# Patient Record
Sex: Male | Born: 1990 | Race: White | Hispanic: No | Marital: Single | Smoking: Current every day smoker
Health system: Southern US, Community
[De-identification: ages and names within clinical notes are randomized; demographics above are authoritative.]

## PROBLEM LIST (undated history)

## (undated) DIAGNOSIS — R454 Irritability and anger: Secondary | ICD-10-CM

## (undated) DIAGNOSIS — F32A Depression, unspecified: Secondary | ICD-10-CM

## (undated) DIAGNOSIS — F329 Major depressive disorder, single episode, unspecified: Secondary | ICD-10-CM

## (undated) DIAGNOSIS — F1910 Other psychoactive substance abuse, uncomplicated: Secondary | ICD-10-CM

## (undated) DIAGNOSIS — B1920 Unspecified viral hepatitis C without hepatic coma: Secondary | ICD-10-CM

## (undated) DIAGNOSIS — F319 Bipolar disorder, unspecified: Secondary | ICD-10-CM

## (undated) DIAGNOSIS — F909 Attention-deficit hyperactivity disorder, unspecified type: Secondary | ICD-10-CM

## (undated) HISTORY — PX: TOOTH EXTRACTION: SUR596

---
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
## 2010-10-04 ENCOUNTER — Emergency Department (HOSPITAL_COMMUNITY)
Admission: EM | Admit: 2010-10-04 | Discharge: 2010-10-04 | Payer: Self-pay | Source: Home / Self Care | Admitting: Emergency Medicine

## 2012-07-26 ENCOUNTER — Encounter (HOSPITAL_COMMUNITY): Payer: Self-pay | Admitting: Family Medicine

## 2012-07-26 ENCOUNTER — Emergency Department (HOSPITAL_COMMUNITY)
Admission: EM | Admit: 2012-07-26 | Discharge: 2012-07-26 | Disposition: A | Discharge status: Home / Self Care | Payer: Self-pay | Attending: Emergency Medicine | Admitting: Emergency Medicine

## 2012-07-26 DIAGNOSIS — S50819A Abrasion of unspecified forearm, initial encounter: Secondary | ICD-10-CM

## 2012-07-26 DIAGNOSIS — F17200 Nicotine dependence, unspecified, uncomplicated: Secondary | ICD-10-CM | POA: Insufficient documentation

## 2012-07-26 DIAGNOSIS — X789XXA Intentional self-harm by unspecified sharp object, initial encounter: Secondary | ICD-10-CM | POA: Insufficient documentation

## 2012-07-26 DIAGNOSIS — F319 Bipolar disorder, unspecified: Secondary | ICD-10-CM | POA: Insufficient documentation

## 2012-07-26 DIAGNOSIS — F909 Attention-deficit hyperactivity disorder, unspecified type: Secondary | ICD-10-CM | POA: Insufficient documentation

## 2012-07-26 DIAGNOSIS — IMO0002 Reserved for concepts with insufficient information to code with codable children: Secondary | ICD-10-CM | POA: Insufficient documentation

## 2012-07-26 DIAGNOSIS — R45851 Suicidal ideations: Secondary | ICD-10-CM | POA: Insufficient documentation

## 2012-07-26 LAB — CBC WITH DIFFERENTIAL/PLATELET
Eosinophils Relative: 2 % (ref 0–5)
HCT: 47.6 % (ref 39.0–52.0)
Lymphocytes Relative: 26 % (ref 12–46)
Lymphs Abs: 2 10*3/uL (ref 0.7–4.0)
MCV: 92.1 fL (ref 78.0–100.0)
Monocytes Absolute: 0.7 10*3/uL (ref 0.1–1.0)
RBC: 5.17 MIL/uL (ref 4.22–5.81)
WBC: 7.7 10*3/uL (ref 4.0–10.5)

## 2012-07-26 LAB — URINALYSIS, ROUTINE W REFLEX MICROSCOPIC
Hgb urine dipstick: NEGATIVE
Protein, ur: NEGATIVE mg/dL
Urobilinogen, UA: 1 mg/dL (ref 0.0–1.0)

## 2012-07-26 LAB — COMPREHENSIVE METABOLIC PANEL
BUN: 9 mg/dL (ref 6–23)
CO2: 29 mEq/L (ref 19–32)
Calcium: 9.4 mg/dL (ref 8.4–10.5)
Creatinine, Ser: 1.11 mg/dL (ref 0.50–1.35)
GFR calc Af Amer: >90 mL/min (ref >90)
GFR calc non Af Amer: >90 mL/min (ref >90)
Glucose, Bld: 126 mg/dL — ABNORMAL HIGH (ref 70–99)

## 2012-07-26 LAB — RAPID URINE DRUG SCREEN, HOSP PERFORMED
Opiates: NOT DETECTED
Tetrahydrocannabinol: NOT DETECTED

## 2012-07-26 MED ORDER — NICOTINE 21 MG/24HR TD PT24
21.0000 mg | MEDICATED_PATCH | Freq: Once | TRANSDERMAL | Status: DC
  Administered 2012-07-26: 21 mg via TRANSDERMAL
  Filled 2012-07-26: qty 1

## 2012-07-26 MED ORDER — TETANUS-DIPHTH-ACELL PERTUSSIS 5-2.5-18.5 LF-MCG/0.5 IM SUSP
0.5000 mL | Freq: Once | INTRAMUSCULAR | Status: AC
  Administered 2012-07-26: 0.5 mL via INTRAMUSCULAR
  Filled 2012-07-26: qty 0.5

## 2012-07-26 MED ORDER — ACETAMINOPHEN 325 MG PO TABS: 650.0000 mg | ORAL_TABLET | ORAL | Status: DC | PRN

## 2012-07-26 MED ORDER — LORAZEPAM 1 MG PO TABS
1.0000 mg | ORAL_TABLET | Freq: Three times a day (TID) | ORAL | Status: DC | PRN
  Administered 2012-07-26: 1 mg via ORAL
  Filled 2012-07-26: qty 1

## 2012-07-26 MED ORDER — IBUPROFEN 200 MG PO TABS: 600.0000 mg | ORAL_TABLET | Freq: Three times a day (TID) | ORAL | Status: DC | PRN
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
## 2012-07-26 NOTE — ED Notes (Signed)
Security notified for wand patient

## 2012-07-26 NOTE — ED Notes (Signed)
Regular diet tray, no sharps, ordered for patient

## 2012-07-26 NOTE — ED Provider Notes (Signed)
  Physical Exam  BP 108/68  Pulse 74  Temp 98.2 F (36.8 C) (Oral)  Resp 19  SpO2 98%  Physical Exam  ED Course  Procedures  MDM Patient has been seen by telepsych and discharge has been recommended. He'll be discharged with community mental health followup.      Juliet Rude. Rubin Payor, MD 07/26/12 2154

## 2012-07-26 NOTE — ED Notes (Signed)
Per pt used rusty razorblade to carve girlfriends name in arm. Denies SI or HI. Came here for tetanus shot

## 2012-07-26 NOTE — ED Provider Notes (Addendum)
History   This chart was scribed for Richardean Canal, MD by Charolett Bumpers . The patient was seen in room TR05C/TR05C. Patient's care was started at 1756.   CSN: [id number] Arrival date & time 07/26/12  1716  First MD Initiated Contact with Patient 07/26/12 1756      Chief Complaint  Patient presents with  . Wound Check    tetanus shot    The history is provided by the patient. No language interpreter was used.  Caleb Ellis is a 21 y.o. male who presents to the Emergency Department complaining of multiple superficial abrasions to his left forearm. He states he used a rusty razorblade to carve his girlfriends name into his arm. He reports some mild SI and states he "can't do this anymore". He denies HI. He states he has superficial abrasions on his knuckles from punching walls. He states that his tetanus is not UTD. He reports a h/o ADHD, Bipolar disorder and aggressive behavior. He states that he currently does not take medications but requests psychiatric help. He reports he is a smoker. He reports occasional alcohol, marijuana and pill use.   History reviewed. No pertinent past medical history.  History reviewed. No pertinent past surgical history.  History reviewed. No pertinent family history.  History  Substance Use Topics  . Smoking status: Current Every Day Smoker  . Smokeless tobacco: Not on file  . Alcohol Use: No      Review of Systems  Constitutional: Negative for fever and chills.  Respiratory: Negative for shortness of breath.   Gastrointestinal: Negative for nausea and vomiting.  Skin: Positive for wound.  Neurological: Negative for weakness.  All other systems reviewed and are negative.    Allergies  Review of patient's allergies indicates no known allergies.  Home Medications   Current Outpatient Rx  Name Route Sig Dispense Refill  . DIPHENHYDRAMINE-APAP (SLEEP) 25-500 MG PO TABS Oral Take 3 tablets by mouth at bedtime as needed. For pain       BP 108/68  Pulse 74  Temp 98.2 F (36.8 C) (Oral)  Resp 19  SpO2 98%  Physical Exam  Nursing note and vitals reviewed. Constitutional: He is oriented to person, place, and time. He appears well-developed and well-nourished. No distress.       Pt is anxious, but NAD.  HENT:  Head: Normocephalic and atraumatic.  Eyes: EOM are normal.  Neck: Neck supple. No tracheal deviation present.  Cardiovascular: Normal rate, regular rhythm and normal heart sounds.   No murmur heard. Pulmonary/Chest: Effort normal and breath sounds normal. No respiratory distress. He has no wheezes.  Musculoskeletal: Normal range of motion.  Neurological: He is alert and oriented to person, place, and time.  Skin: Skin is warm and dry.       Superficial abrasions to left forearm, spelling out Autumn. Abrasions to knuckles.   Psychiatric: He has a normal mood and affect. His behavior is normal.    ED Course  Procedures (including critical care time)  DIAGNOSTIC STUDIES: Oxygen Saturation is 97% on room air, normal by my interpretation.    COORDINATION OF CARE:  18:30-Discussed planned course of treatment with the patient including keeping the wound clean, who is agreeable at this time. Discussed strict return precautions in which to return to ED. Pt states that he also needs psychiatry help. Will order a consult with ACT team and Telepsych, full psychiatric work up and move to [employer] [facility].   18:38-Medication Orders: Acetaminophen (Tylenol) tablet 650  mg-every 4 hrs PRN, Ibuprofen (Advil, Motrin) tablet 600 mg-every 8 hrs PRN; Lorazepam (Ativan) tablet 1 mg-every 8 hrs PRN  18:45-Medication Orders: TDap (Boostrix) injection 0.5 mL-once.   Labs Reviewed  COMPREHENSIVE METABOLIC PANEL - Abnormal; Notable for the following:    Glucose, Bld 126 (*)     Total Bilirubin 0.2 (*)     All other components within normal limits  CBC WITH DIFFERENTIAL  ETHANOL  URINALYSIS, ROUTINE W REFLEX MICROSCOPIC  URINE RAPID  DRUG SCREEN (HOSP PERFORMED)   No results found.   1. Abrasion of forearm   2. Suicidal ideation   3. Bipolar 1 disorder       MDM  Caleb Ellis is a 21 y.o. male here with aggressive behavior and multiple abrasions. He will receive tetanus shot. Will also need psych to evaluate him for mild suicidal ideations and restarting his bipolar meds. Will transfer to behavioral health.    This document was completed by the scribe at my direction and I have reviewed its accuracy. I have personally examined the patient and agrees with the above document.   Chaney Malling, MD      Richardean Canal, MD 07/26/12 1845  Richardean Canal, MD 07/26/12 [phone]

## 2012-07-26 NOTE — ED Notes (Signed)
Behavioral Patient Checklist completed.  Patient Belongings Inventoried: 1 khaki/jean pants, 1 belt, 1 pair sneakers, 1 black/gray hoody. No jewelry, electronics, medicine or miscellaneous items.

## 2012-07-26 NOTE — ED Notes (Signed)
Patient has been wanded by security.  

## 2012-07-26 NOTE — ED Notes (Signed)
Patient unable to void at this time. Urinal provided

## 2012-07-26 NOTE — ED Notes (Signed)
Pt speaking with Specialists on Call at this time. Family at bedside.

## 2012-07-26 NOTE — ED Notes (Signed)
Patient report given to [facility], RN. Patient will be wanded and transported to POD C for psych evaluation/clearance

## 2012-11-16 ENCOUNTER — Emergency Department (HOSPITAL_COMMUNITY)
Admission: EM | Admit: 2012-11-16 | Discharge: 2012-11-16 | Disposition: A | Discharge status: Home / Self Care | Payer: Self-pay | Attending: Emergency Medicine | Admitting: Emergency Medicine

## 2012-11-16 DIAGNOSIS — IMO0002 Reserved for concepts with insufficient information to code with codable children: Secondary | ICD-10-CM | POA: Insufficient documentation

## 2012-11-16 DIAGNOSIS — F17200 Nicotine dependence, unspecified, uncomplicated: Secondary | ICD-10-CM | POA: Insufficient documentation

## 2012-11-16 DIAGNOSIS — L02419 Cutaneous abscess of limb, unspecified: Secondary | ICD-10-CM

## 2012-11-16 NOTE — ED Notes (Signed)
Pt c/o abscess to R axilla area. States he noticed it about 2 weeks ago. Area painful to touch per pt.

## 2012-11-16 NOTE — Progress Notes (Signed)
Pt noted without pcp and insurance coverage noted in EPIC. Cm reviewed and provided pt with [facility] community [facility] information for [place] self pay pcps and affordable care act information Pt appreciative of resources offered

## 2012-11-16 NOTE — ED Provider Notes (Signed)
History   This chart was scribed for non-physician practitioner working with Derwood Kaplan, MD by Leone Payor, ED Scribe. This patient was seen in room WTR8/WTR8 and the patient's care was started at 1504.   CSN: [id number]  Arrival date & time 11/16/12  1504   First MD Initiated Contact with Patient 11/16/12 1511      No chief complaint on file.    The history is provided by the patient. No language interpreter was used.    Caleb Ellis is a 22 y.o. male who presents to the Emergency Department complaining of an abscess to his R axillary region that he noticed 1.5 weeks ago. States the abscess has been gradually increasing. He had an abscess similar to this in the past which went away after he "popped" it. He tried taking ibuprofen 1 day ago with no relief. Admits to associated irritation with pain rated 2/10, worse with palpation. No drainage. Denies fever or chills.  Pt is a current everyday smoker but denies alcohol use. No past medical history on file.  No past surgical history on file.  No family history on file.  History  Substance Use Topics  . Smoking status: Current Every Day Smoker  . Smokeless tobacco: Not on file  . Alcohol Use: No      Review of Systems A complete 10 system review of systems was obtained and all systems are negative except as noted in the HPI and PMH.    Allergies  Review of patient's allergies indicates no known allergies.  Home Medications   Current Outpatient Rx  Name  Route  Sig  Dispense  Refill  . DIPHENHYDRAMINE-APAP (SLEEP) 25-500 MG PO TABS   Oral   Take 3 tablets by mouth at bedtime as needed. For pain           There were no vitals taken for this visit.  Physical Exam  Nursing note and vitals reviewed. Constitutional: He is oriented to person, place, and time. He appears well-developed and well-nourished. No distress.  HENT:  Head: Normocephalic and atraumatic.  Eyes: Conjunctivae normal and EOM are normal.   Neck: Normal range of motion. Neck supple. No tracheal deviation present.  Cardiovascular: Normal rate, regular rhythm and normal heart sounds.   Pulmonary/Chest: Effort normal and breath sounds normal. No respiratory distress. He has no wheezes. He has no rales. He exhibits no tenderness.  Musculoskeletal: Normal range of motion.  Neurological: He is alert and oriented to person, place, and time.  Skin: Skin is warm and dry.       2 cm diameter indurated abscess present under R axilla. No drainage or surrounding cellulitis.  Psychiatric: He has a normal mood and affect. His behavior is normal.    ED Course  Procedures (including critical care time) INCISION AND DRAINAGE Performed by: Johnnette Gourd Consent: Verbal consent obtained. Risks and benefits: risks, benefits and alternatives were discussed Type: abscess  Body area: R axilla  Anesthesia: local infiltration  Incision was made with a scalpel.  Local anesthetic: lidocaine 2% with epinephrine  Anesthetic total: 2 ml  Complexity: complex Blunt dissection to break up loculations  Drainage: purulent  Drainage amount: small  Packing material: none  Patient tolerance: Patient tolerated the procedure well with no immediate complications.    DIAGNOSTIC STUDIES: Oxygen Saturation is 98% on room air, normal by my interpretation.    COORDINATION OF CARE:  3:24 PM Discussed treatment plan which includes I&D with pt at bedside and pt agreed to  plan.     Labs Reviewed - No data to display No results found.   1. Abscess of axilla       MDM  22 y/o male with abscess without cellulitis. Small amount of purulent drainage present. No surrounding cellulitis. Advised him to f/u for recheck in 2 days. Advised warm compresses. Return precautions discussed. Patient states understanding of plan and is agreeable.      I personally performed the services described in this documentation, which was scribed in my presence.  The recorded information has been reviewed and is accurate.    Trevor Mace, PA-C 11/16/12 [phone]

## 2012-11-17 NOTE — ED Provider Notes (Signed)
Medical screening examination/treatment/procedure(s) were performed by non-physician practitioner and as supervising physician I was immediately available for consultation/collaboration.  Derwood Kaplan, MD 11/17/12 4010

## 2013-04-13 ENCOUNTER — Emergency Department (HOSPITAL_COMMUNITY)
Admission: EM | Admit: 2013-04-13 | Discharge: 2013-04-14 | Disposition: A | Discharge status: Home / Self Care | Payer: Self-pay | Attending: Emergency Medicine | Admitting: Emergency Medicine

## 2013-04-13 ENCOUNTER — Encounter (HOSPITAL_COMMUNITY): Payer: Self-pay

## 2013-04-13 DIAGNOSIS — R454 Irritability and anger: Secondary | ICD-10-CM

## 2013-04-13 DIAGNOSIS — F17200 Nicotine dependence, unspecified, uncomplicated: Secondary | ICD-10-CM | POA: Insufficient documentation

## 2013-04-13 DIAGNOSIS — F911 Conduct disorder, childhood-onset type: Secondary | ICD-10-CM | POA: Insufficient documentation

## 2013-04-13 DIAGNOSIS — F319 Bipolar disorder, unspecified: Secondary | ICD-10-CM | POA: Insufficient documentation

## 2013-04-13 DIAGNOSIS — R45851 Suicidal ideations: Secondary | ICD-10-CM | POA: Insufficient documentation

## 2013-04-13 HISTORY — DX: Irritability and anger: R45.4

## 2013-04-13 HISTORY — DX: Bipolar disorder, unspecified: F31.9

## 2013-04-13 HISTORY — DX: Attention-deficit hyperactivity disorder, unspecified type: F90.9

## 2013-04-13 LAB — CBC
Hemoglobin: 16.7 g/dL (ref 13.0–17.0)
MCH: 31.4 pg (ref 26.0–34.0)
RBC: 5.32 MIL/uL (ref 4.22–5.81)

## 2013-04-13 LAB — COMPREHENSIVE METABOLIC PANEL
ALT: 7 U/L (ref 0–53)
Alkaline Phosphatase: 75 U/L (ref 39–117)
CO2: 28 mEq/L (ref 19–32)
Calcium: 9.6 mg/dL (ref 8.4–10.5)
Chloride: 101 mEq/L (ref 96–112)
GFR calc Af Amer: >90 mL/min (ref >90)
GFR calc non Af Amer: >90 mL/min (ref >90)
Glucose, Bld: 94 mg/dL (ref 70–99)
Potassium: 4.3 mEq/L (ref 3.5–5.1)
Sodium: 136 mEq/L (ref 135–145)
Total Bilirubin: 0.4 mg/dL (ref 0.3–1.2)

## 2013-04-13 LAB — ETHANOL: Alcohol, Ethyl (B): <11 mg/dL (ref 0–11)

## 2013-04-13 MED ORDER — ALUM & MAG HYDROXIDE-SIMETH 200-200-20 MG/5ML PO SUSP: 30.0000 mL | ORAL | Status: DC | PRN

## 2013-04-13 MED ORDER — IBUPROFEN 200 MG PO TABS: 600.0000 mg | ORAL_TABLET | Freq: Three times a day (TID) | ORAL | Status: DC | PRN

## 2013-04-13 MED ORDER — ACETAMINOPHEN 325 MG PO TABS: 650.0000 mg | ORAL_TABLET | ORAL | Status: DC | PRN

## 2013-04-13 MED ORDER — ZOLPIDEM TARTRATE 5 MG PO TABS: 5.0000 mg | ORAL_TABLET | Freq: Every evening | ORAL | Status: DC | PRN

## 2013-04-13 MED ORDER — NICOTINE 21 MG/24HR TD PT24: 21.0000 mg | MEDICATED_PATCH | Freq: Every day | TRANSDERMAL | Status: DC

## 2013-04-13 MED ORDER — ONDANSETRON HCL 4 MG PO TABS
4.0000 mg | ORAL_TABLET | Freq: Three times a day (TID) | ORAL | Status: DC | PRN
Start: 2013-04-13 — End: 2013-04-14

## 2013-04-13 NOTE — ED Notes (Signed)
telepsych info faxed/called

## 2013-04-13 NOTE — ED Notes (Signed)
Pt reports being diagnosed ADHD, Bipolar, Depression, Anger Issues in his adolescent years and that mom took him to [employer] when he was 79 and said he was +SI, then she took him a second time and on discharge he went to group homes until he aged out. He hasn't been on meds since he's been out of the [facility] setting but wants to be back on meds but doesn't have insurance. He said he doesn't want the meds to be strong and make him feel like a zombie. He describes extreme mood swings but a desire to control them as well. He said today he argued with his gf he has a son with and also his best friend he lives with and then he just wanted to be left alone so he went to [place] to use WIFI and while there waiting for another friend to pick him up and take him to another friend's house he texted the friend he argued with and lives with and said he was going to go jump off a parking deck and kill himself. Pt is adamant he couldn't kill himself and he said it seeking attention. He has no prior suicide attempts per his report. He admits to being a cutter because he was sad but hasn't cut in a year. He admits to [facility] use and UDS was +THC. He admits to being hard for mom to handle and destroying property and such when he got angry before but he said he's calmed down a  Lot and doesn't do that anymore. He is willing to do outpatient treatment. He's trying to find a job.

## 2013-04-13 NOTE — ED Notes (Signed)
Telepsych in progress. 

## 2013-04-13 NOTE — ED Provider Notes (Signed)
History    This chart was scribed for non-physician practitioner Marlon Pel PA-C working with Juliet Rude. Rubin Payor, MD by Ashley Jacobs, ED scribe. This patient was seen in room WTR1/WLPT1 and the patient's care was started at 5:32 PM  CSN: [id number] Arrival date & time 04/13/13  1647    Chief Complaint  Patient presents with  . Medical Clearance    The history is provided by the police and the patient. No language interpreter was used.   HPI Comments: Caleb Ellis is a 22 y.o. male who presents to the Emergency Department brought in by [employer] after his friend reports he was experiencing SI.  Pt denies this in ED.  His friend reports he threatened to jump off a parking deck after an argument between the two.  Pt states he "just wanted attention".  He reports he is just angry with his friend and with other life stressors.  He reports marijuana use and denies alcohol use or use of any other drugs.  Pt has h/o anxiety and bipolar and no longer takes medication due to cost.  Denies true SI, HI, or anxiety. Admits to depression, bipolar disorder and anger outbursts. Is not taking any medication because it is too expensive. Medicates himself with weed and says it works pretty good. Lives with said friend that called [employer] on him.   No past medical history on file. No past surgical history on file. No family history on file. History  Substance Use Topics  . Smoking status: Current Every Day Smoker  . Smokeless tobacco: Not on file  . Alcohol Use: No    Review of Systems  All other systems reviewed and are negative.    Allergies  Review of patient's allergies indicates no known allergies.  Home Medications   Current Outpatient Rx  Name  Route  Sig  Dispense  Refill  . diphenhydrAMINE (BENADRYL) 12.5 MG/5ML liquid   Oral   Take 25 mg by mouth once.          BP 111/72  Pulse 60  Temp(Src) 98.1 F (36.7 C) (Oral)  Resp 15  SpO2 97% Physical Exam  Nursing note and  vitals reviewed. Constitutional: He appears well-developed and well-nourished. No distress.  HENT:  Head: Normocephalic and atraumatic.  Mouth/Throat: Oropharynx is clear and moist. No oropharyngeal exudate.  Eyes: Conjunctivae are normal. No scleral icterus.  Neck: Normal range of motion. Neck supple.  Cardiovascular: Normal rate, regular rhythm and intact distal pulses.   Pulmonary/Chest: Effort normal and breath sounds normal. No respiratory distress. He has no wheezes.  Abdominal: Soft. Bowel sounds are normal. He exhibits no mass. There is no tenderness. There is no rebound and no guarding.  Musculoskeletal: Normal range of motion. He exhibits no edema.  Neurological: He is alert.  Speech is clear and goal oriented Moves extremities without ataxia  Skin: Skin is warm and dry. He is not diaphoretic.  Psychiatric: His mood appears not anxious. His affect is blunt. His speech is not rapid and/or pressured. He is not agitated and not aggressive. He exhibits a depressed mood. He expresses no homicidal and no suicidal ideation. He expresses no suicidal plans and no homicidal plans.  Denies suicidal ideation     ED Course  Procedures (including critical care time) DIAGNOSTIC STUDIES: Oxygen Saturation is 97% on room air, normal by my interpretation.    COORDINATION OF CARE: 5:55 PM Discussed course of care with pt which includes being evaluated by Psychiatrist. Pt understands and  agrees.   Labs Reviewed  CBC  COMPREHENSIVE METABOLIC PANEL  URINE RAPID DRUG SCREEN (HOSP PERFORMED)  ETHANOL   No results found. 1. Bipolar 1 disorder   2. Outbursts of anger   3. Suicidal ideation     MDM  Telepsych ordered. HOlding orders placed. Med rec reviewed,   I personally performed the services described in this documentation, which was scribed in my presence. The recorded information has been reviewed and is accurate.   Dorthula Matas, PA-C 04/13/13 1756

## 2013-04-13 NOTE — ED Notes (Signed)
Pt BIB [employer]. Pt was threatening to jump off a parking deck and pt's friend called [employer] to report this. Pt states he is not suicidal and that he told his friend that because "I just wanted attention." Pt also states, "I would never kill myself, I'm too scared to." Pt goes on and states that he has a kid he needs to take care of and wouldn't kill himself. Pt is calm, cooperative. [employer] outside of pt's room.

## 2013-04-13 NOTE — ED Notes (Signed)
Dr Henderson Cloud just called in to get information on pt. Telepsych to be done shortly.

## 2013-04-13 NOTE — ED Notes (Signed)
Talking quietly w/ friend, eatting supper.

## 2013-04-13 NOTE — ED Provider Notes (Signed)
Medical screening examination/treatment/procedure(s) were performed by non-physician practitioner and as supervising physician I was immediately available for consultation/collaboration.  Chrisean Kloth R. Rakin Lemelle, MD 04/13/13 2153 

## 2013-04-14 NOTE — ED Notes (Signed)
Went over discharge instructions with pt including referrals and to call [facility] ASAP to set up an appointment. Also pointed out the mobile crisis number and their services with pt as well. Pt indicated full understanding and that he would comply with f/u recommendations. Pt walked out to change and discharge. He already called his ride to come pick him up at the main entrance of ED.

## 2013-04-14 NOTE — ED Provider Notes (Signed)
Tele-psych complete and recommendation is for discharge with outpatient follow up. Contact info and instructions provided to patient.  Gilda Crease, MD 04/14/13 [phone]

## 2013-05-14 ENCOUNTER — Emergency Department (HOSPITAL_COMMUNITY)
Admission: EM | Admit: 2013-05-14 | Discharge: 2013-05-14 | Disposition: A | Discharge status: Home / Self Care | Payer: Self-pay | Attending: Emergency Medicine | Admitting: Emergency Medicine

## 2013-05-14 DIAGNOSIS — F17200 Nicotine dependence, unspecified, uncomplicated: Secondary | ICD-10-CM | POA: Insufficient documentation

## 2013-05-14 DIAGNOSIS — F319 Bipolar disorder, unspecified: Secondary | ICD-10-CM | POA: Insufficient documentation

## 2013-05-14 DIAGNOSIS — Z8659 Personal history of other mental and behavioral disorders: Secondary | ICD-10-CM | POA: Insufficient documentation

## 2013-05-14 LAB — COMPREHENSIVE METABOLIC PANEL
AST: 18 U/L (ref 0–37)
Albumin: 4.8 g/dL (ref 3.5–5.2)
Alkaline Phosphatase: 78 U/L (ref 39–117)
Chloride: 97 mEq/L (ref 96–112)
Potassium: 3.9 mEq/L (ref 3.5–5.1)
Sodium: 134 mEq/L — ABNORMAL LOW (ref 135–145)
Total Bilirubin: 0.5 mg/dL (ref 0.3–1.2)
Total Protein: 7.6 g/dL (ref 6.0–8.3)

## 2013-05-14 LAB — CBC
HCT: 46.7 % (ref 39.0–52.0)
MCHC: 35.1 g/dL (ref 30.0–36.0)
Platelets: 241 10*3/uL (ref 150–400)
RDW: 12.2 % (ref 11.5–15.5)
WBC: 11.7 10*3/uL — ABNORMAL HIGH (ref 4.0–10.5)

## 2013-05-14 LAB — RAPID URINE DRUG SCREEN, HOSP PERFORMED
Amphetamines: NOT DETECTED
Benzodiazepines: NOT DETECTED
Tetrahydrocannabinol: POSITIVE — AB

## 2013-05-14 NOTE — ED Notes (Signed)
Pt has black hat, white rubber band, black shoes, brown pants, black socks, and red boxers.  Locked in number 27
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
## 2013-05-14 NOTE — ED Notes (Signed)
Pt information has been faxed for telepsych. Specialist contacted, pt is 6th in line to speak with psychiatrist.

## 2013-05-14 NOTE — ED Notes (Signed)
Telepsych complete

## 2013-05-14 NOTE — ED Provider Notes (Signed)
CSN: [id number]     Arrival date & time 05/14/13  0027 History     First MD Initiated Contact with Patient 05/14/13 [phone]     Chief Complaint  Patient presents with  . Medical Clearance   HPI  History provided by the patient, [employer] and recent medical charts. The patient is a 22 year old male with history of bipolar disorder with outbursts of anger who presents in [employer] custody with IVC paperwork for concerns of suicidal ideation and intention. Patient states that he was over at a friend's house did become upset. He reports leaving the house and walking away but was later picked up by the police. Patient's friend had taken IVC paperwork. Patient denies stating that he wanted to harm himself. Patient states that he never had a cell phone in his possession and could not taxed anyone that he was suicidal. Patient was seen for similar circumstances last month. He he was discharged home and told he should follow up with a psychiatrist but has not done so. He is non-name medications. Patient does admit to marijuana use earlier. Denies any other drug or alcohol use. No other aggravating or alleviating factors. No other associated symptoms. He adamantly denies SI or HI.     Past Medical History  Diagnosis Date  . ADHD (attention deficit hyperactivity disorder)   . Bipolar disorder   . Outbursts of anger    No past surgical history on file. No family history on file. History  Substance Use Topics  . Smoking status: Current Every Day Smoker  . Smokeless tobacco: Not on file  . Alcohol Use: No    Review of Systems  Psychiatric/Behavioral: Negative for suicidal ideas.  All other systems reviewed and are negative.    Allergies  Review of patient's allergies indicates no known allergies.  Home Medications   Current Outpatient Rx  Name  Route  Sig  Dispense  Refill  . diphenhydrAMINE (BENADRYL) 12.5 MG/5ML liquid   Oral   Take 25 mg by mouth once.          BP 128/86  Pulse 76   Temp(Src) 98.6 F (37 C) (Oral)  Resp 18  SpO2 95% Physical Exam  Nursing note and vitals reviewed. Constitutional: He is oriented to person, place, and time. He appears well-developed and well-nourished.  HENT:  Head: Normocephalic.  Cardiovascular: Normal rate and regular rhythm.   Pulmonary/Chest: Effort normal and breath sounds normal. No respiratory distress. He has no wheezes. He has no rales.  Abdominal: Soft.  Musculoskeletal: Normal range of motion.  Neurological: He is alert and oriented to person, place, and time.  Skin: Skin is warm.  Psychiatric: His affect is angry. He expresses no homicidal and no suicidal ideation.    ED Course   Procedures   Results for orders placed during the hospital encounter of 05/14/13  CBC      Result Value Range   WBC 11.7 (*) 4.0 - 10.5 K/uL   RBC 5.27  4.22 - 5.81 MIL/uL   Hemoglobin 16.4  13.0 - 17.0 g/dL   HCT 11.9  14.7 - 82.9 %   MCV 88.6  78.0 - 100.0 fL   MCH 31.1  26.0 - 34.0 pg   MCHC 35.1  30.0 - 36.0 g/dL   RDW 56.2  13.0 - 86.5 %   Platelets 241  150 - 400 K/uL  COMPREHENSIVE METABOLIC PANEL      Result Value Range   Sodium 134 (*) 135 - 145 mEq/L  Potassium 3.9  3.5 - 5.1 mEq/L   Chloride 97  96 - 112 mEq/L   CO2 26  19 - 32 mEq/L   Glucose, Bld 137 (*) 70 - 99 mg/dL   BUN 7  6 - 23 mg/dL   Creatinine, Ser 6.96  0.50 - 1.35 mg/dL   Calcium 9.9  8.4 - 29.5 mg/dL   Total Protein 7.6  6.0 - 8.3 g/dL   Albumin 4.8  3.5 - 5.2 g/dL   AST 18  0 - 37 U/L   ALT 11  0 - 53 U/L   Alkaline Phosphatase 78  39 - 117 U/L   Total Bilirubin 0.5  0.3 - 1.2 mg/dL   GFR calc non Af Amer >90  >90 mL/min   GFR calc Af Amer >90  >90 mL/min  ETHANOL      Result Value Range   Alcohol, Ethyl (B) <11  0 - 11 mg/dL  URINE RAPID DRUG SCREEN (HOSP PERFORMED)      Result Value Range   Opiates NONE DETECTED  NONE DETECTED   Cocaine NONE DETECTED  NONE DETECTED   Benzodiazepines NONE DETECTED  NONE DETECTED   Amphetamines NONE  DETECTED  NONE DETECTED   Tetrahydrocannabinol POSITIVE (*) NONE DETECTED   Barbiturates NONE DETECTED  NONE DETECTED        1. Bipolar affective disorder     MDM  Patient seen and evaluated. Patient denies SI or HI. Patient does appear upset and frustrated with having to be here. Patient was seen and evaluated under IVC for possible SI last month discharged after tele psych. He has not followed up with any outpatient psychiatrist.  Patient moved to psych ED. Holding orders in place. Tele-Psych consult ordered  Angus Seller, PA-C 05/14/13 [phone]

## 2013-05-16 NOTE — ED Provider Notes (Signed)
Medical screening examination/treatment/procedure(s) were performed by non-physician practitioner and as supervising physician I was immediately available for consultation/collaboration.  Fields Oros, MD 05/16/13 1103 

## 2016-08-17 ENCOUNTER — Emergency Department (HOSPITAL_COMMUNITY)
Admission: EM | Admit: 2016-08-17 | Discharge: 2016-08-17 | Disposition: A | Discharge status: Home / Self Care | Payer: Self-pay | Attending: Emergency Medicine | Admitting: Emergency Medicine

## 2016-08-17 ENCOUNTER — Encounter (HOSPITAL_COMMUNITY): Payer: Self-pay

## 2016-08-17 ENCOUNTER — Emergency Department (HOSPITAL_COMMUNITY): Payer: Self-pay

## 2016-08-17 DIAGNOSIS — W1839XA Other fall on same level, initial encounter: Secondary | ICD-10-CM | POA: Insufficient documentation

## 2016-08-17 DIAGNOSIS — Y9361 Activity, american tackle football: Secondary | ICD-10-CM | POA: Insufficient documentation

## 2016-08-17 DIAGNOSIS — S82891A Other fracture of right lower leg, initial encounter for closed fracture: Secondary | ICD-10-CM

## 2016-08-17 DIAGNOSIS — F909 Attention-deficit hyperactivity disorder, unspecified type: Secondary | ICD-10-CM | POA: Insufficient documentation

## 2016-08-17 DIAGNOSIS — F17210 Nicotine dependence, cigarettes, uncomplicated: Secondary | ICD-10-CM | POA: Insufficient documentation

## 2016-08-17 DIAGNOSIS — Y929 Unspecified place or not applicable: Secondary | ICD-10-CM | POA: Insufficient documentation

## 2016-08-17 DIAGNOSIS — S8264XA Nondisplaced fracture of lateral malleolus of right fibula, initial encounter for closed fracture: Secondary | ICD-10-CM | POA: Insufficient documentation

## 2016-08-17 DIAGNOSIS — Y999 Unspecified external cause status: Secondary | ICD-10-CM | POA: Insufficient documentation

## 2016-08-17 MED ORDER — NAPROXEN 500 MG PO TABS: 500.0000 mg | ORAL_TABLET | Freq: Two times a day (BID) | ORAL | 0 refills | Status: DC

## 2016-08-17 MED ORDER — HYDROCODONE-ACETAMINOPHEN 5-325 MG PO TABS: 1.0000 | ORAL_TABLET | Freq: Four times a day (QID) | ORAL | 0 refills | Status: DC | PRN

## 2016-08-17 NOTE — ED Provider Notes (Signed)
[facility]-EMERGENCY DEPT Provider Note   CSN: [id number] Arrival date & time: 08/17/16  1253  By signing my name below, I, Soijett Blue, attest that this documentation has been prepared under the direction and in the presence of Neema Fluegge, PA-C Electronically Signed: Soijett Blue, ED Scribe. 08/17/16. 1:29 PM.  History   Chief Complaint Chief Complaint  Patient presents with  . Ankle Pain    HPI Caleb SchickCameron Ellis is a 25 y.o. male  who presents to the Emergency Department complaining of right ankle pain onset 1 week ago. Pt states that he was playing football with his son when he twisted his right ankle and fell to the ground. Pt reports that his right ankle pain is worsened with weight bearing. Pain is mild to moderate, throbbing, nonradiating. Pt denies alleviating factors for his right ankle pain. Pt is having associated symptoms of mildly resolved right ankle swelling. He notes that he has tried ibuprofen and ice with no relief of his symptoms. He denies color change, wound, hitting his head, LOC, and any other symptoms.    The history is provided by the patient. No language interpreter was used.    Past Medical History:  Diagnosis Date  . ADHD (attention deficit hyperactivity disorder)   . Bipolar disorder (HCC)   . Outbursts of anger     There are no active problems to display for this patient.   History reviewed. No pertinent surgical history.     Home Medications    Prior to Admission medications   Medication Sig Start Date End Date Taking? Authorizing Provider  diphenhydrAMINE (BENADRYL) 12.5 MG/5ML liquid Take 25 mg by mouth once.    Historical Provider, MD  HYDROcodone-acetaminophen (NORCO/VICODIN) 5-325 MG tablet Take 1 tablet by mouth every 6 (six) hours as needed. 08/17/16   Sixto Bowdish C Myldred Raju, PA-C  naproxen (NAPROSYN) 500 MG tablet Take 1 tablet (500 mg total) by mouth 2 (two) times daily. 08/17/16   Anselm PancoastShawn C Makenzee Choudhry, PA-C    Family History History reviewed. No pertinent  family history.  Social History Social History  Substance Use Topics  . Smoking status: Current Every Day Smoker    Packs/day: 0.50    Types: Cigarettes  . Smokeless tobacco: Never Used  . Alcohol use Yes     Comment: occ     Allergies   Patient has no known allergies.   Review of Systems Review of Systems  Musculoskeletal: Positive for arthralgias (right ankle) and joint swelling (right ankle).  Skin: Negative for color change and wound.  Neurological: Negative for syncope, weakness and numbness.     Physical Exam Updated Vital Signs BP 118/84 (BP Location: Left Arm)   Pulse 72   Temp 98 F (36.7 C) (Oral)   Resp 18   Wt 160 lb (72.6 kg)   SpO2 100%   Physical Exam  Constitutional: He appears well-developed and well-nourished. No distress.  HENT:  Head: Normocephalic and atraumatic.  Eyes: Conjunctivae are normal.  Neck: Neck supple.  Cardiovascular: Normal rate, regular rhythm and intact distal pulses.   Pulmonary/Chest: Effort normal.  Musculoskeletal:       Right ankle: He exhibits swelling. He exhibits normal range of motion. Tenderness. Lateral malleolus tenderness found.  Tenderness and some swelling to the right lateral malleolus. FROM in right ankle. No sensory deficits. Distal pulses intact.   Neurological: He is alert.  Skin: Skin is warm and dry. He is not diaphoretic.  Psychiatric: He has a normal mood and affect. His behavior  is normal.  Nursing note and vitals reviewed.   ED Treatments / Results  DIAGNOSTIC STUDIES: Oxygen Saturation is 100% on RA, nl by my interpretation.    COORDINATION OF CARE: 1:29 PM Discussed treatment plan with pt at bedside which includes right ankle xray, ice, ibuprofen, and pt agreed to plan.   Radiology Dg Ankle Complete Right  Result Date: 08/17/2016 CLINICAL DATA:  Lateral right ankle pain after sports injury earlier today. EXAM: RIGHT ANKLE - COMPLETE 3+ VIEW COMPARISON:  None. FINDINGS: Lateral right ankle  soft tissue swelling. Tiny nondisplaced avulsion fracture fragment at the inferior tip of the right lateral malleolus. Otherwise no fracture. No subluxation. No appreciable arthropathy. No suspicious focal osseous lesion. No radiopaque foreign body. IMPRESSION: Tiny nondisplaced avulsion fracture fragment at the inferior tip of the right lateral malleolus with overlying soft tissue swelling. Otherwise no right ankle fracture or subluxation. Electronically Signed   By: Delbert PhenixJason A Poff M.D.   On: 08/17/2016 13:45    Procedures Procedures (including critical care time)  Medications Ordered in ED Medications - No data to display   Initial Impression / Assessment and Plan / ED Course  I have reviewed the triage vital signs and the nursing notes.  Pertinent imaging results that were available during my care of the patient were reviewed by me and considered in my medical decision making (see chart for details).  Clinical Course     Patient presents with right ankle injury that occurred a week ago. Small avulsion fracture to the right lateral ankle. Cam boot walker. Orthopedic follow-up.    Final Clinical Impressions(s) / ED Diagnoses   Final diagnoses:  Closed avulsion fracture of right ankle, initial encounter    New Prescriptions Discharge Medication List as of 08/17/2016  1:57 PM    START taking these medications   Details  HYDROcodone-acetaminophen (NORCO/VICODIN) 5-325 MG tablet Take 1 tablet by mouth every 6 (six) hours as needed., Starting Wed 08/17/2016, Print    naproxen (NAPROSYN) 500 MG tablet Take 1 tablet (500 mg total) by mouth 2 (two) times daily., Starting Wed 08/17/2016, Print       I personally performed the services described in this documentation, which was scribed in my presence. The recorded information has been reviewed and is accurate.    Anselm PancoastShawn C Jeriah Skufca, PA-C 08/17/16 1459    Azalia BilisKevin Campos, MD 08/17/16 [phone]

## 2016-08-17 NOTE — Discharge Instructions (Signed)
There was a very small fracture to the outside of the right ankle. This is typically treated with a walking boot. Follow up with orthopedics for continued management of this issue. Elevate the extremity whenever possible. Naproxen or ibuprofen to reduce pain and inflammation. Vicodin for severe pain. Do not drive or perform other dangerous activities while taking the vicodin.

## 2016-08-17 NOTE — ED Triage Notes (Signed)
Pt c/o R ankle pain x 1 week.  Pain score 5/10.  Pt reports that he was wrestling w/ his son and injured his ankle.  NAD noted. Pt ambulated to room w/o difficulty.

## 2017-02-13 ENCOUNTER — Encounter (HOSPITAL_COMMUNITY): Payer: Self-pay

## 2017-02-13 ENCOUNTER — Emergency Department (HOSPITAL_COMMUNITY): Payer: Self-pay

## 2017-02-13 DIAGNOSIS — F909 Attention-deficit hyperactivity disorder, unspecified type: Secondary | ICD-10-CM | POA: Insufficient documentation

## 2017-02-13 DIAGNOSIS — Y999 Unspecified external cause status: Secondary | ICD-10-CM | POA: Insufficient documentation

## 2017-02-13 DIAGNOSIS — S80212A Abrasion, left knee, initial encounter: Secondary | ICD-10-CM | POA: Insufficient documentation

## 2017-02-13 DIAGNOSIS — Z5321 Procedure and treatment not carried out due to patient leaving prior to being seen by health care provider: Secondary | ICD-10-CM | POA: Insufficient documentation

## 2017-02-13 DIAGNOSIS — F17210 Nicotine dependence, cigarettes, uncomplicated: Secondary | ICD-10-CM | POA: Insufficient documentation

## 2017-02-13 DIAGNOSIS — S80211A Abrasion, right knee, initial encounter: Secondary | ICD-10-CM | POA: Insufficient documentation

## 2017-02-13 DIAGNOSIS — Y9289 Other specified places as the place of occurrence of the external cause: Secondary | ICD-10-CM | POA: Insufficient documentation

## 2017-02-13 DIAGNOSIS — Y9351 Activity, roller skating (inline) and skateboarding: Secondary | ICD-10-CM | POA: Insufficient documentation

## 2017-02-13 NOTE — ED Triage Notes (Signed)
Pt reports he fell off of his skateboard tonight and has abrasions to right and left knee. Pain worse on right knee. Ambulatory.

## 2017-02-14 ENCOUNTER — Emergency Department (HOSPITAL_COMMUNITY)
Admission: EM | Admit: 2017-02-14 | Discharge: 2017-02-14 | Disposition: A | Discharge status: Home / Self Care | Payer: Self-pay | Attending: Emergency Medicine | Admitting: Emergency Medicine

## 2017-02-14 DIAGNOSIS — S80211A Abrasion, right knee, initial encounter: Secondary | ICD-10-CM | POA: Insufficient documentation

## 2017-02-14 DIAGNOSIS — S80212A Abrasion, left knee, initial encounter: Secondary | ICD-10-CM | POA: Insufficient documentation

## 2017-02-14 DIAGNOSIS — S0081XA Abrasion of other part of head, initial encounter: Secondary | ICD-10-CM | POA: Insufficient documentation

## 2017-02-14 DIAGNOSIS — F909 Attention-deficit hyperactivity disorder, unspecified type: Secondary | ICD-10-CM | POA: Insufficient documentation

## 2017-02-14 DIAGNOSIS — Y999 Unspecified external cause status: Secondary | ICD-10-CM | POA: Insufficient documentation

## 2017-02-14 DIAGNOSIS — M25561 Pain in right knee: Secondary | ICD-10-CM

## 2017-02-14 DIAGNOSIS — Z79899 Other long term (current) drug therapy: Secondary | ICD-10-CM | POA: Insufficient documentation

## 2017-02-14 DIAGNOSIS — Y9351 Activity, roller skating (inline) and skateboarding: Secondary | ICD-10-CM | POA: Insufficient documentation

## 2017-02-14 DIAGNOSIS — Y929 Unspecified place or not applicable: Secondary | ICD-10-CM | POA: Insufficient documentation

## 2017-02-14 DIAGNOSIS — F17210 Nicotine dependence, cigarettes, uncomplicated: Secondary | ICD-10-CM | POA: Insufficient documentation

## 2017-02-14 MED ORDER — IBUPROFEN 600 MG PO TABS: 600.0000 mg | ORAL_TABLET | Freq: Three times a day (TID) | ORAL | 0 refills | Status: DC

## 2017-02-14 NOTE — Discharge Instructions (Signed)
Rest - please stay off knee as much as possible Ice - ice for 20 minutes at a time, several times a day Compression - wear brace to provide support and help reduce swelling Elevate - elevate right leg above level of heart Ibuprofen - take with food. Take up to 3-4 times daily Follow up with orthopedics if your knee is not better in 2 weeks

## 2017-02-14 NOTE — ED Triage Notes (Signed)
Pt c/o right knee pain since yesterday after falling. [facility], left before being seen.

## 2017-02-14 NOTE — ED Notes (Signed)
Called pt x 2

## 2017-02-14 NOTE — ED Provider Notes (Signed)
[facility]-EMERGENCY DEPT Provider Note   CSN: [id number] Arrival date & time: 02/14/17  1950  By signing my name below, I, Majel HomerPeyton Lee, attest that this documentation has been prepared under the direction and in the presence of Terance HartKelly Jemimah Cressy, PA-C . Electronically Signed: Majel HomerPeyton Lee, Scribe. 02/14/2017. 9:05 PM.  History   Chief Complaint Chief Complaint  Patient presents with  . Knee Pain    right   The history is provided by the patient. No language interpreter was used.   HPI Comments: Caleb Ellis is a 26 y.o. male who presents to the Emergency Department complaining of gradually worsening, right knee pain s/p a fall that occurred last night. Pt reports he was skateboarding yesterday evening when he suddenly lost his balance and fell forward onto his face and bilateral knees. He states associated right knee swelling and abrasions to his bilateral knees and right cheek. He notes his pain is exacerbated when ambulating and bearing weight. Pt [facility] in which he received imaging but "the wait was 5 hours long" so he left without being seen. He states he has applied ice to his knee with no relief and has not taken any medications to relieve his pain. He denies any other complaints and states he does not have a PCP.   Past Medical History:  Diagnosis Date  . ADHD (attention deficit hyperactivity disorder)   . Bipolar disorder (HCC)   . Outbursts of anger    There are no active problems to display for this patient.  No past surgical history on file.  Home Medications    Prior to Admission medications   Medication Sig Start Date End Date Taking? Authorizing Provider  diphenhydrAMINE (BENADRYL) 12.5 MG/5ML liquid Take 25 mg by mouth once.    [provider]  HYDROcodone-acetaminophen (NORCO/VICODIN) 5-325 MG tablet Take 1 tablet by mouth every 6 (six) hours as needed. 08/17/16   Joy, Shawn C, PA-C  naproxen (NAPROSYN) 500 MG tablet Take 1 tablet (500 mg  total) by mouth 2 (two) times daily. 08/17/16   Joy, Hillard DankerShawn C, PA-C    Family History No family history on file.  Social History Social History  Substance Use Topics  . Smoking status: Current Every Day Smoker    Packs/day: 0.50    Types: Cigarettes  . Smokeless tobacco: Never Used  . Alcohol use Yes     Comment: occ   Allergies   Patient has no known allergies.  Review of Systems Review of Systems  Musculoskeletal: Positive for arthralgias and joint swelling.  Skin:       +abrasions to bilateral knees and right cheek    Physical Exam Updated Vital Signs BP 130/86   Pulse 74   Temp 98.3 F (36.8 C) (Oral)   Resp 16   SpO2 99%   Physical Exam  Constitutional: He is oriented to person, place, and time. He appears well-developed and well-nourished.  HENT:  Head: Normocephalic.  Abrasion noted to right cheek and neck which are minimally tender  Eyes: EOM are normal.  Neck: Normal range of motion.  Pulmonary/Chest: Effort normal.  Abdominal: He exhibits no distension.  Musculoskeletal: He exhibits tenderness.  Decreased ROM on the right side. Strength is 5/5, ambulatory with a limp. Diffusely tender, limited exam due to pain.   Neurological: He is alert and oriented to person, place, and time.  Skin:  Bilateral abrasions to both knees.   Psychiatric: He has a normal mood and affect.  Nursing  note and vitals reviewed.  ED Treatments / Results  DIAGNOSTIC STUDIES:  Oxygen Saturation is 99% on RA, normal by my interpretation.    COORDINATION OF CARE:  8:59 PM Discussed treatment plan with pt at bedside and pt agreed to plan.  Labs (all labs ordered are listed, but only abnormal results are displayed) Labs Reviewed - No data to display  EKG  EKG Interpretation None       Radiology Dg Knee Complete 4 Views Right  Result Date: 02/13/2017 CLINICAL DATA:  Right knee pain after injury, fall while skateboarding tonight. EXAM: RIGHT KNEE - COMPLETE 4+ VIEW  COMPARISON:  None. FINDINGS: No evidence of fracture, dislocation, or joint effusion. No evidence of arthropathy or other focal bone abnormality. Dressing overlies the anterior knee with skin thickening and soft tissue edema. No soft tissue air or radiopaque foreign body. IMPRESSION: No fracture or subluxation. Soft tissue thickening anteriorly with overlying dressing in place. Electronically Signed   By: Rubye Oaks M.D.   On: 02/13/2017 22:56   Procedures Procedures (including critical care time)  Medications Ordered in ED Medications - No data to display  Initial Impression / Assessment and Plan / ED Course  I have reviewed the triage vital signs and the nursing notes.  Pertinent labs & imaging results that were available during my care of the patient were reviewed by me and considered in my medical decision making (see chart for details).  Patient X-Ray negative for obvious fracture or dislocation.  Pt advised to follow up with orthopedics. Patient given  Knee sleeve while in ED, conservative therapy recommended and discussed. Patient will be discharged home & is agreeable with above plan. Returns precautions discussed. Pt appears safe for discharge.  I personally performed the services described in this documentation, which was scribed in my presence. The recorded information has been reviewed and is accurate.   Final Clinical Impressions(s) / ED Diagnoses   Final diagnoses:  Acute pain of right knee    New Prescriptions New Prescriptions   No medications on file     Beryle Quant 02/15/17 0055    Lorre Nick, MD 02/16/17 1712

## 2017-02-14 NOTE — ED Notes (Signed)
PT DISCHARGED. INSTRUCTIONS AND PRESCRIPTION GIVEN. AAOX4. PT IN NO APPARENT DISTRESS. THE OPPORTUNITY TO ASK QUESTIONS WAS PROVIDED. 

## 2017-02-14 NOTE — ED Notes (Signed)
Pt called x 3 .  °

## 2017-07-22 ENCOUNTER — Encounter (HOSPITAL_BASED_OUTPATIENT_CLINIC_OR_DEPARTMENT_OTHER): Payer: Self-pay | Admitting: Emergency Medicine

## 2017-07-22 ENCOUNTER — Emergency Department (HOSPITAL_BASED_OUTPATIENT_CLINIC_OR_DEPARTMENT_OTHER)
Admission: EM | Admit: 2017-07-22 | Discharge: 2017-07-22 | Disposition: A | Discharge status: Home / Self Care | Payer: Self-pay | Attending: Emergency Medicine | Admitting: Emergency Medicine

## 2017-07-22 DIAGNOSIS — Y939 Activity, unspecified: Secondary | ICD-10-CM | POA: Insufficient documentation

## 2017-07-22 DIAGNOSIS — Y92009 Unspecified place in unspecified non-institutional (private) residence as the place of occurrence of the external cause: Secondary | ICD-10-CM | POA: Insufficient documentation

## 2017-07-22 DIAGNOSIS — S39012A Strain of muscle, fascia and tendon of lower back, initial encounter: Secondary | ICD-10-CM | POA: Insufficient documentation

## 2017-07-22 DIAGNOSIS — Y999 Unspecified external cause status: Secondary | ICD-10-CM | POA: Insufficient documentation

## 2017-07-22 DIAGNOSIS — F17210 Nicotine dependence, cigarettes, uncomplicated: Secondary | ICD-10-CM | POA: Insufficient documentation

## 2017-07-22 DIAGNOSIS — J069 Acute upper respiratory infection, unspecified: Secondary | ICD-10-CM | POA: Insufficient documentation

## 2017-07-22 DIAGNOSIS — Z79899 Other long term (current) drug therapy: Secondary | ICD-10-CM | POA: Insufficient documentation

## 2017-07-22 DIAGNOSIS — X500XXA Overexertion from strenuous movement or load, initial encounter: Secondary | ICD-10-CM | POA: Insufficient documentation

## 2017-07-22 MED ORDER — IBUPROFEN 800 MG PO TABS: 800.0000 mg | ORAL_TABLET | Freq: Three times a day (TID) | ORAL | 0 refills | Status: DC | PRN

## 2017-07-22 MED ORDER — CYCLOBENZAPRINE HCL 10 MG PO TABS: 10.0000 mg | ORAL_TABLET | Freq: Two times a day (BID) | ORAL | 0 refills | Status: DC | PRN

## 2017-07-22 NOTE — Discharge Instructions (Signed)
Take the medication as prescribed. Call the number on these instructions to get a primary care physician and to be seen if not feeling better by next week. Or you can go to an urgent care center. Ask your primary care physician to help you to stop smoking. Your cold should get better within 1-2 weeks. Return if concern for any reason

## 2017-07-22 NOTE — ED Provider Notes (Addendum)
[facility]   CSN: [id number] Arrival date & time: 07/22/17  1305     History   Chief Complaint Chief Complaint  Patient presents with  . Back Pain    HPI Caleb Ellis is a 26 y.o. male.  HPI Complains of low back pain nonradiating onset yesterday after lifting a couch while he was helping a friend to move. No loss of bladder or bowel control. Pain is worse with changing positions improved with remaining still. Patient also complains of nasal congestion and cough since yesterday. No fever no shortness of breathno sneeze. No treatment prior to coming here. He thinks he has a cold No other associated symptoms Past Medical History:  Diagnosis Date  . ADHD (attention deficit hyperactivity disorder)   . Bipolar disorder (HCC)   . Outbursts of anger     There are no active problems to display for this patient.   History reviewed. No pertinent surgical history.     Home Medications    Prior to Admission medications   Medication Sig Start Date End Date Taking? Authorizing Provider  diphenhydrAMINE (BENADRYL) 12.5 MG/5ML liquid Take 25 mg by mouth once.    [provider]  HYDROcodone-acetaminophen (NORCO/VICODIN) 5-325 MG tablet Take 1 tablet by mouth every 6 (six) hours as needed. 08/17/16   Joy, Shawn C, PA-C  ibuprofen (ADVIL,MOTRIN) 600 MG tablet Take 1 tablet (600 mg total) by mouth 3 (three) times daily. 02/14/17   Bethel Born, PA-C  naproxen (NAPROSYN) 500 MG tablet Take 1 tablet (500 mg total) by mouth 2 (two) times daily. 08/17/16   Joy, Shawn C, PA-C  Medications none  Family History History reviewed. No pertinent family history.  Social History Social History  Substance Use Topics  . Smoking status: Current Every Day Smoker    Packs/day: 0.50    Types: Cigarettes  . Smokeless tobacco: Never Used  . Alcohol use Yes     Comment: occ   Positive marijuana use no IV drug use  Allergies   Patient has no known  allergies.   Review of Systems Review of Systems  Constitutional: Negative.   HENT: Positive for congestion.   Respiratory: Positive for cough.   Cardiovascular: Negative.   Gastrointestinal: Negative.   Musculoskeletal: Positive for back pain.  Skin: Negative.   Neurological: Negative.   Psychiatric/Behavioral: Negative.   All other systems reviewed and are negative.    Physical Exam Updated Vital Signs BP 115/60 (BP Location: Left Arm)   Pulse (!) 102   Temp 99.2 F (37.3 C) (Oral)   Resp 18   Ht  (1.727 m)   Wt 63.5 kg (140 lb)   SpO2 99%   BMI 21.29 kg/m   Physical Exam  Constitutional: He is oriented to person, place, and time. He appears well-developed and well-nourished. No distress.  HENT:  Head: Normocephalic and atraumatic.  Oropharynx normal  Eyes: Pupils are equal, round, and reactive to light. Conjunctivae are normal.  Neck: Neck supple. No tracheal deviation present. No thyromegaly present.  Cardiovascular:  No murmur heard. Mildly tachycardic  Pulmonary/Chest: Effort normal and breath sounds normal.  Abdominal: Soft. Bowel sounds are normal. He exhibits no distension. There is no tenderness.  Musculoskeletal: Normal range of motion. He exhibits no edema or tenderness.  Entire spine nontender. He has pain at lumbar area when standing up from a supine position all 4 extremities without redness or tenderness neurovascularly intact  Neurological: He is alert and oriented to  person, place, and time. He displays normal reflexes. He exhibits normal muscle tone. Coordination normal.  Gait normal motor strength 5 over 5 overall DTRs symmetric bilaterally at knee jerk ankle jerk and biceps toes downward going bilaterally  Skin: Skin is warm and dry. No rash noted.  Psychiatric: He has a normal mood and affect.  Nursing note and vitals reviewed.    ED Treatments / Results  Labs (all labs ordered are listed, but only abnormal results are displayed) Labs  Reviewed - No data to display  EKG  EKG Interpretation None       Radiology No results found.  Procedures Procedures (including critical care time)  Medications Ordered in ED Medications - No data to display   Initial Impression / Assessment and Plan / ED Course  I have reviewed the triage vital signs and the nursing notes.  Pertinent labs & imaging results that were available during my care of the patient were reviewed by me and considered in my medical decision making (see chart for details).     Imaging not indicated. No red flags. Plan prescriptions ibuprofen, Flexeril. Referral primary care. I counseled patient for 5 minutes on smoking cessation  Final Clinical Impressions(s) / ED Diagnoses  Diagnosis #1 lumbar strain #2 upper respiratory infection #3 tobacco abuse Final diagnoses:  None    New Prescriptions New Prescriptions   No medications on file     Doug Sou, MD 07/22/17 6045    Doug Sou, MD 07/22/17 [phone]

## 2017-07-22 NOTE — ED Notes (Signed)
Pt discharged to home with family. NAD.  

## 2017-07-22 NOTE — ED Triage Notes (Signed)
patinet states that he was helping someone move furniture yesterday and hurt his lower back. The patient reports that that he is also getting a cold

## 2017-08-18 ENCOUNTER — Encounter (HOSPITAL_BASED_OUTPATIENT_CLINIC_OR_DEPARTMENT_OTHER): Payer: Self-pay | Admitting: Emergency Medicine

## 2017-08-18 ENCOUNTER — Other Ambulatory Visit: Payer: Self-pay

## 2017-08-18 ENCOUNTER — Emergency Department (HOSPITAL_BASED_OUTPATIENT_CLINIC_OR_DEPARTMENT_OTHER)
Admission: EM | Admit: 2017-08-18 | Discharge: 2017-08-18 | Disposition: A | Discharge status: Home / Self Care | Payer: Self-pay | Attending: Emergency Medicine | Admitting: Emergency Medicine

## 2017-08-18 DIAGNOSIS — W57XXXA Bitten or stung by nonvenomous insect and other nonvenomous arthropods, initial encounter: Secondary | ICD-10-CM | POA: Insufficient documentation

## 2017-08-18 DIAGNOSIS — M79605 Pain in left leg: Secondary | ICD-10-CM | POA: Insufficient documentation

## 2017-08-18 DIAGNOSIS — F17210 Nicotine dependence, cigarettes, uncomplicated: Secondary | ICD-10-CM | POA: Insufficient documentation

## 2017-08-18 MED ORDER — DOXYCYCLINE HYCLATE 100 MG PO TABS: 100.0000 mg | ORAL_TABLET | Freq: Once | ORAL | Status: DC

## 2017-08-18 MED ORDER — DOXYCYCLINE HYCLATE 100 MG PO CAPS: 100.0000 mg | ORAL_CAPSULE | Freq: Two times a day (BID) | ORAL | 0 refills | Status: DC

## 2017-08-18 NOTE — ED Notes (Signed)
Pt denies fever or any other symptoms.

## 2017-08-18 NOTE — ED Provider Notes (Signed)
[facility] EMERGENCY DEPARTMENT Provider Note   CSN: [id number] Arrival date & time: 08/18/17  0539     History   Chief Complaint Chief Complaint  Patient presents with  . Insect Bite    HPI Caleb Ellis is a 26 y.o. male.  The history is provided by the patient.  Animal Bite  Contact animal:  Insect Location:  Leg Leg injury location:  L upper leg Time since incident:  1 day Pain details:    Quality:  Aching   Severity:  Mild   Timing:  Constant   Progression:  Unchanged Incident location:  Outside Provoked: unprovoked   Notifications:  None Animal's rabies vaccination status:  Not immunized Animal in possession: no   Tetanus status:  Up to date Relieved by:  Nothing Worsened by:  Nothing Ineffective treatments:  None tried Associated symptoms: no fever     Past Medical History:  Diagnosis Date  . ADHD (attention deficit hyperactivity disorder)   . Bipolar disorder (HCC)   . Outbursts of anger     There are no active problems to display for this patient.   History reviewed. No pertinent surgical history.     Home Medications    Prior to Admission medications   Medication Sig Start Date End Date Taking? Authorizing Provider  doxycycline (VIBRAMYCIN) 100 MG capsule Take 1 capsule (100 mg total) 2 (two) times daily by mouth. One po bid x 7 days 08/18/17   Kayla Weekes, MD    Family History No family history on file.  Social History Social History   Tobacco Use  . Smoking status: Current Every Day Smoker    Packs/day: 0.50    Types: Cigarettes  . Smokeless tobacco: Never Used  Substance Use Topics  . Alcohol use: Yes    Comment: occ  . Drug use: Yes    Types: Marijuana    Comment: "alot"     Allergies   Patient has no known allergies.   Review of Systems Review of Systems  Constitutional: Negative for fever.  All other systems reviewed and are negative.    Physical Exam Updated Vital Signs BP 122/89 (BP  Location: Right Arm)   Pulse 89   Temp 97.9 F (36.6 C) (Oral)   Resp 16   Ht 5\' 7"  (1.702 m)   Wt 68 kg (150 lb)   SpO2 98%   BMI 23.49 kg/m   Physical Exam  Constitutional: He is oriented to person, place, and time. He appears well-developed and well-nourished. No distress.  HENT:  Head: Normocephalic and atraumatic.  Mouth/Throat: No oropharyngeal exudate.  Eyes: Conjunctivae are normal. Pupils are equal, round, and reactive to light.  Neck: Normal range of motion. Neck supple.  Cardiovascular: Normal rate, regular rhythm, normal heart sounds and intact distal pulses.  Pulmonary/Chest: Effort normal and breath sounds normal. No stridor. He has no wheezes. He has no rales.  Abdominal: Soft. Bowel sounds are normal. He exhibits no mass. There is no tenderness. There is no rebound and no guarding.  Musculoskeletal: Normal range of motion.  Neurological: He is alert and oriented to person, place, and time. He displays normal reflexes.  Skin: Skin is warm and dry. Capillary refill takes less than 2 seconds.  Psychiatric: He has a normal mood and affect.     ED Treatments / Results   Vitals:   08/18/17 0544  BP: 122/89  Pulse: 89  Resp: 16  Temp: 97.9 F (36.6 C)  SpO2: 98%  Radiology No results found.  Procedures Procedures (including critical care time)  Medications Ordered in ED Medications  doxycycline (VIBRA-TABS) tablet 100 mg (not administered)      Final Clinical Impressions(s) / ED Diagnoses   Final diagnoses:  Tick bite, initial encounter   All questions answered to the patient's satisfaction.   Strict return precautions for swelling or the lips or tongue, chest pain, dyspnea on exertion, new weakness or numbness changes in vision or speech, fevers, weakness persistent pain, Inability to tolerate liquids or food, changes in voice cough, altered mental status or any concerns. No signs of systemic illness or infection. The patient is  nontoxic-appearing on exam and vital signs are within normal limits.    I have reviewed the triage vital signs and the nursing notes. Pertinent labs &imaging results that were available during my care of the patient were reviewed by me and considered in my medical decision making (see chart for details).  After history, exam, and medical workup I feel the patient has been appropriately medically screened and is safe for discharge home. Pertinent diagnoses were discussed with the patient. Patient was given return precautions. ED Discharge Orders        Ordered    doxycycline (VIBRAMYCIN) 100 MG capsule  2 times daily     08/18/17 0609       Witt Plitt, MD 08/18/17 [phone]

## 2017-08-18 NOTE — ED Triage Notes (Signed)
Pt removed a tick from left upper thigh x 3 days ago. Pt concerned about redness at site.

## 2017-08-19 ENCOUNTER — Encounter (HOSPITAL_BASED_OUTPATIENT_CLINIC_OR_DEPARTMENT_OTHER): Payer: Self-pay | Admitting: Emergency Medicine

## 2017-10-14 ENCOUNTER — Emergency Department (HOSPITAL_COMMUNITY)
Admission: EM | Admit: 2017-10-14 | Discharge: 2017-10-14 | Disposition: A | Discharge status: Other Acute Inpatient Hospital | Payer: Self-pay | Attending: Emergency Medicine | Admitting: Emergency Medicine

## 2017-10-14 ENCOUNTER — Encounter (HOSPITAL_COMMUNITY): Payer: Self-pay | Admitting: *Deleted

## 2017-10-14 ENCOUNTER — Other Ambulatory Visit: Payer: Self-pay

## 2017-10-14 ENCOUNTER — Encounter (HOSPITAL_COMMUNITY): Payer: Self-pay | Admitting: Emergency Medicine

## 2017-10-14 ENCOUNTER — Inpatient Hospital Stay (HOSPITAL_COMMUNITY)
Admission: AD | Admit: 2017-10-14 | Discharge: 2017-10-18 | DRG: 885 | Disposition: A | Discharge status: Home / Self Care | Payer: Federal, State, Local not specified - Other | Source: Intra-hospital | Attending: Psychiatry | Admitting: Psychiatry

## 2017-10-14 DIAGNOSIS — F6381 Intermittent explosive disorder: Secondary | ICD-10-CM | POA: Diagnosis present

## 2017-10-14 DIAGNOSIS — F909 Attention-deficit hyperactivity disorder, unspecified type: Secondary | ICD-10-CM | POA: Diagnosis present

## 2017-10-14 DIAGNOSIS — Z79899 Other long term (current) drug therapy: Secondary | ICD-10-CM | POA: Diagnosis not present

## 2017-10-14 DIAGNOSIS — F332 Major depressive disorder, recurrent severe without psychotic features: Secondary | ICD-10-CM | POA: Diagnosis not present

## 2017-10-14 DIAGNOSIS — Z630 Problems in relationship with spouse or partner: Secondary | ICD-10-CM | POA: Diagnosis not present

## 2017-10-14 DIAGNOSIS — R45851 Suicidal ideations: Secondary | ICD-10-CM | POA: Insufficient documentation

## 2017-10-14 DIAGNOSIS — Z915 Personal history of self-harm: Secondary | ICD-10-CM

## 2017-10-14 DIAGNOSIS — F17210 Nicotine dependence, cigarettes, uncomplicated: Secondary | ICD-10-CM | POA: Diagnosis present

## 2017-10-14 DIAGNOSIS — G4700 Insomnia, unspecified: Secondary | ICD-10-CM | POA: Diagnosis present

## 2017-10-14 DIAGNOSIS — F1910 Other psychoactive substance abuse, uncomplicated: Secondary | ICD-10-CM | POA: Diagnosis not present

## 2017-10-14 DIAGNOSIS — F1994 Other psychoactive substance use, unspecified with psychoactive substance-induced mood disorder: Secondary | ICD-10-CM | POA: Diagnosis not present

## 2017-10-14 DIAGNOSIS — F1914 Other psychoactive substance abuse with psychoactive substance-induced mood disorder: Secondary | ICD-10-CM | POA: Diagnosis present

## 2017-10-14 DIAGNOSIS — F319 Bipolar disorder, unspecified: Secondary | ICD-10-CM | POA: Insufficient documentation

## 2017-10-14 DIAGNOSIS — F419 Anxiety disorder, unspecified: Secondary | ICD-10-CM | POA: Diagnosis not present

## 2017-10-14 LAB — CBC
HEMATOCRIT: 47.6 % (ref 39.0–52.0)
HEMOGLOBIN: 17 g/dL (ref 13.0–17.0)
MCH: 32 pg (ref 26.0–34.0)
MCHC: 35.7 g/dL (ref 30.0–36.0)
MCV: 89.6 fL (ref 78.0–100.0)
Platelets: 250 10*3/uL (ref 150–400)
RBC: 5.31 MIL/uL (ref 4.22–5.81)
RDW: 12.4 % (ref 11.5–15.5)
WBC: 7.2 10*3/uL (ref 4.0–10.5)

## 2017-10-14 LAB — COMPREHENSIVE METABOLIC PANEL
ALBUMIN: 4.8 g/dL (ref 3.5–5.0)
ALT: 21 U/L (ref 17–63)
AST: 34 U/L (ref 15–41)
Alkaline Phosphatase: 62 U/L (ref 38–126)
Anion gap: 10 (ref 5–15)
BUN: 11 mg/dL (ref 6–20)
CHLORIDE: 100 mmol/L — AB (ref 101–111)
CO2: 25 mmol/L (ref 22–32)
CREATININE: 1.2 mg/dL (ref 0.61–1.24)
Calcium: 9.3 mg/dL (ref 8.9–10.3)
GFR calc Af Amer: >60 mL/min (ref >60)
GLUCOSE: 90 mg/dL (ref 65–99)
Potassium: 3.5 mmol/L (ref 3.5–5.1)
Sodium: 135 mmol/L (ref 135–145)
Total Bilirubin: 1.5 mg/dL — ABNORMAL HIGH (ref 0.3–1.2)
Total Protein: 7.5 g/dL (ref 6.5–8.1)

## 2017-10-14 LAB — RAPID URINE DRUG SCREEN, HOSP PERFORMED
Amphetamines: POSITIVE — AB
BARBITURATES: NOT DETECTED
Benzodiazepines: NOT DETECTED
Cocaine: POSITIVE — AB
Opiates: NOT DETECTED
TETRAHYDROCANNABINOL: POSITIVE — AB

## 2017-10-14 LAB — ACETAMINOPHEN LEVEL: Acetaminophen (Tylenol), Serum: <10 ug/mL — ABNORMAL LOW (ref 10–30)

## 2017-10-14 LAB — SALICYLATE LEVEL

## 2017-10-14 MED ORDER — NICOTINE 21 MG/24HR TD PT24
21.0000 mg | MEDICATED_PATCH | Freq: Every day | TRANSDERMAL | Status: DC
  Administered 2017-10-15: 21 mg via TRANSDERMAL
  Filled 2017-10-14 (×2): qty 1

## 2017-10-14 MED ORDER — ONDANSETRON 4 MG PO TBDP: 4.0000 mg | ORAL_TABLET | Freq: Four times a day (QID) | ORAL | Status: AC | PRN

## 2017-10-14 MED ORDER — ACETAMINOPHEN 325 MG PO TABS: 650.0000 mg | ORAL_TABLET | Freq: Four times a day (QID) | ORAL | Status: DC | PRN

## 2017-10-14 MED ORDER — CHLORDIAZEPOXIDE HCL 25 MG PO CAPS
25.0000 mg | ORAL_CAPSULE | Freq: Four times a day (QID) | ORAL | Status: DC | PRN
  Administered 2017-10-15: 25 mg via ORAL
  Filled 2017-10-14: qty 1

## 2017-10-14 MED ORDER — HYDROXYZINE HCL 25 MG PO TABS
25.0000 mg | ORAL_TABLET | Freq: Three times a day (TID) | ORAL | Status: DC | PRN
Start: 2017-10-14 — End: 2017-10-18
  Administered 2017-10-14 – 2017-10-18 (×5): 25 mg via ORAL
  Filled 2017-10-14 (×5): qty 1
  Filled 2017-10-14: qty 10

## 2017-10-14 MED ORDER — MAGNESIUM HYDROXIDE 400 MG/5ML PO SUSP: 30.0000 mL | Freq: Every day | ORAL | Status: DC | PRN

## 2017-10-14 MED ORDER — LOPERAMIDE HCL 2 MG PO CAPS: 2.0000 mg | ORAL_CAPSULE | ORAL | Status: AC | PRN

## 2017-10-14 MED ORDER — HYDROXYZINE HCL 25 MG PO TABS
25.0000 mg | ORAL_TABLET | Freq: Three times a day (TID) | ORAL | Status: DC
  Administered 2017-10-14: 25 mg via ORAL
  Filled 2017-10-14: qty 1

## 2017-10-14 MED ORDER — TRAZODONE HCL 50 MG PO TABS
50.0000 mg | ORAL_TABLET | Freq: Every evening | ORAL | Status: DC | PRN
  Administered 2017-10-14 – 2017-10-17 (×4): 50 mg via ORAL
  Filled 2017-10-14 (×3): qty 1
  Filled 2017-10-14: qty 7
  Filled 2017-10-14: qty 1

## 2017-10-14 MED ORDER — ALUM & MAG HYDROXIDE-SIMETH 200-200-20 MG/5ML PO SUSP: 30.0000 mL | ORAL | Status: DC | PRN

## 2017-10-14 NOTE — ED Notes (Signed)
SBAR Report received from previous nurse. Pt received calm and visible on unit. Pt denies current SI/ HI, A/V H, depression, anxiety, or pain at this time, and appears otherwise stable and free of distress. Pt reminded of camera surveillance, q 15 min rounds, and rules of the milieu. Will continue to assess. 

## 2017-10-14 NOTE — ED Notes (Signed)
[facility] Pt aware and discharge summery reviewed with patient as well as review of medications. All personal items given to pellham and patient signed all discharge paperwork. Pt escorted off unit by transport.

## 2017-10-14 NOTE — Tx Team (Signed)
Initial Treatment Plan 10/14/2017 10:17 PM Caleb Ellis ZOX:[id number]    PATIENT STRESSORS: Financial difficulties Legal issue Occupational concerns Substance abuse Other: break up with fiance, kicked out of home   PATIENT STRENGTHS: Active sense of humor Average or above average intelligence Communication skills General fund of knowledge   PATIENT IDENTIFIED PROBLEMS: SI  depression  Substance abuse    "I need to better myself"   "I just don't want to be depressed or angry anymore"            DISCHARGE CRITERIA:  Improved stabilization in mood, thinking, and/or behavior Need for constant or close observation no longer present  PRELIMINARY DISCHARGE PLAN: Outpatient therapy  PATIENT/FAMILY INVOLVEMENT: This treatment plan has been presented to and reviewed with the patient, Caleb SchickCameron Ellis.  The patient and family have been given the opportunity to ask questions and make suggestions.  Arrie AranChurch, Jonay Hitchcock J, [place]RN 10/14/2017, 10:17 PM
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
## 2017-10-14 NOTE — ED Provider Notes (Signed)
[facility]-EMERGENCY DEPT Provider Note   CSN: [id number] Arrival date & time: 10/14/17  1054     History   Chief Complaint Chief Complaint  Patient presents with  . Suicidal    HPI Caleb Ellis is a 27 y.o. male.  HPI   Caleb Ellis is a 27 year old male with a history of ADHD, bipolar disorder, polysubstance use, previous suicide attempts who presents to the emergency department for suicidal ideations.  Patient states that he has been in a relationship with his girlfriend for 3 years and recently ended.  States that he has a child with this woman, but is not able to see the child anymore.  For the last 3 days he has been having thoughts of hurting himself with plan to jump off of a parking deck.  States that he told 1 of his friends who brought him into the emergency department today.  Reports marijuana use today and cocaine use last week.  States that he no longer has Medicaid and has not been able to afford outpatient psychiatric treatment or medications for several years now.  Denies fever, chills, headache, chest pain, shortness of breath, abdominal pain, nausea/vomiting, diarrhea, numbness, weakness.   Past Medical History:  Diagnosis Date  . ADHD (attention deficit hyperactivity disorder)   . Bipolar disorder (HCC)   . Outbursts of anger     There are no active problems to display for this patient.   History reviewed. No pertinent surgical history.     Home Medications    Prior to Admission medications   Medication Sig Start Date End Date Taking? Authorizing Provider  doxycycline (VIBRAMYCIN) 100 MG capsule Take 1 capsule (100 mg total) 2 (two) times daily by mouth. One po bid x 7 days 08/18/17   Nicanor AlconPalumbo, April, MD    Family History History reviewed. No pertinent family history.  Social History Social History   Tobacco Use  . Smoking status: Current Every Day Smoker    Packs/day: 0.50    Types: Cigarettes  . Smokeless tobacco: Never  Used  Substance Use Topics  . Alcohol use: Yes    Comment: occ  . Drug use: Yes    Types: Marijuana, Cocaine, Methamphetamines    Comment: "alot", pills, Molly     Allergies   Patient has no known allergies.   Review of Systems Review of Systems  Constitutional: Negative for chills, fatigue and fever.  Eyes: Negative for visual disturbance.  Respiratory: Negative for shortness of breath.   Cardiovascular: Negative for chest pain.  Gastrointestinal: Negative for abdominal pain, diarrhea, nausea and vomiting.  Genitourinary: Negative for difficulty urinating and dysuria.  Musculoskeletal: Negative for arthralgias, gait problem and myalgias.  Skin: Negative for wound.  Neurological: Negative for weakness, numbness and headaches.  Psychiatric/Behavioral: Positive for suicidal ideas. Negative for confusion.     Physical Exam Updated Vital Signs BP 128/77 (BP Location: Left Arm)   Pulse 80   Temp 98.7 F (37.1 C) (Oral)   Resp 16   Ht 5\' 8"  (1.727 m)   Wt 68 kg (150 lb)   SpO2 99%   BMI 22.81 kg/m   Physical Exam  Constitutional: He is oriented to person, place, and time. He appears well-developed and well-nourished. No distress.  HENT:  Head: Normocephalic and atraumatic.  Mouth/Throat: Oropharynx is clear and moist. No oropharyngeal exudate.  Eyes: Conjunctivae and EOM are normal. Pupils are equal, round, and reactive to light. Right eye exhibits no discharge. Left eye exhibits no  discharge. No scleral icterus.  Neck: Normal range of motion. Neck supple.  Cardiovascular: Normal rate, regular rhythm and intact distal pulses. Exam reveals no friction rub.  No murmur heard. Pulmonary/Chest: Effort normal and breath sounds normal. No stridor. No respiratory distress. He has no wheezes. He has no rales.  Abdominal: Soft. Bowel sounds are normal. There is no tenderness. There is no guarding.  Musculoskeletal: Normal range of motion.  Lymphadenopathy:    He has no cervical  adenopathy.  Neurological: He is alert and oriented to person, place, and time. Coordination normal.  Mental Status:  Alert, oriented, thought content appropriate, able to give a coherent history. Speech fluent without evidence of aphasia. Able to follow 2 step commands without difficulty.  Cranial Nerves:  II:  Peripheral visual fields grossly normal, pupils equal, round, reactive to light III,IV, VI: ptosis not present, extra-ocular motions intact bilaterally  V,VII: smile symmetric, facial light touch sensation equal VIII: hearing grossly normal to voice  X: uvula elevates symmetrically  XI: bilateral shoulder shrug symmetric and strong XII: midline tongue extension without fassiculations Motor:  Normal tone. 5/5 in upper and lower extremities bilaterally including strong and equal grip strength and dorsiflexion/plantar flexion Sensory: Pinprick and light touch normal in all extremities.  Deep Tendon Reflexes: 2+ and symmetric in the biceps and patella Cerebellar: normal finger-to-nose with bilateral upper extremities Gait: normal gait and balance  Skin: Skin is warm and dry. Capillary refill takes less than 2 seconds. He is not diaphoretic.  Psychiatric: He has a normal mood and affect. His behavior is normal.  Nursing note and vitals reviewed.    ED Treatments / Results  Labs (all labs ordered are listed, but only abnormal results are displayed) Labs Reviewed  COMPREHENSIVE METABOLIC PANEL - Abnormal; Notable for the following components:      Result Value   Chloride 100 (*)    Total Bilirubin 1.5 (*)    All other components within normal limits  CBC  ETHANOL  SALICYLATE LEVEL  ACETAMINOPHEN LEVEL  RAPID URINE DRUG SCREEN, HOSP PERFORMED    EKG  EKG Interpretation None       Radiology No results found.  Procedures Procedures (including critical care time)  Medications Ordered in ED Medications - No data to display   Initial Impression / Assessment and  Plan / ED Course  I have reviewed the triage vital signs and the nursing notes.  Pertinent labs & imaging results that were available during my care of the patient were reviewed by me and considered in my medical decision making (see chart for details).    Presents with suicidal ideation.  Labs reviewed.  CMP, CBC unremarkable.  Salicylate, ethanol, acetaminophen and rapid UDS pending.   Patient is medically cleared.  Placed in psych hold and TTS consulted.   Per TTS Counselor Pollyann Glen, patient recommended for inpatient admission. Awaiting inpatient bed.   Final Clinical Impressions(s) / ED Diagnoses   Final diagnoses:  Suicidal ideation    ED Discharge Orders    None       Lawrence Marseilles 10/14/17 1635    Jacalyn Lefevre, MD 10/18/17 2310
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
## 2017-10-14 NOTE — ED Notes (Signed)
[facility] pt signed for discharge, belongings and [place] called

## 2017-10-14 NOTE — ED Triage Notes (Signed)
Patient arrived via friend.  Having thoughts of harming himself with a plan to jump off parking deck.  Having thoughts for past 3 days.  Patient was in a relationship with girlfriend for 3 years and it ended, child is involved.  Not able to see child.    Patient stating he needs help.  Patient states he has a history of alcohol and drug abuse.  Used drugs this morning.   Denies taking any medication.

## 2017-10-14 NOTE — BH Assessment (Signed)
[facility] Assessment Progress Note  Case was staffed with Parks FNP who recommended a inpatient admission as appropriate bed placement is investigated.     

## 2017-10-15 DIAGNOSIS — F1994 Other psychoactive substance use, unspecified with psychoactive substance-induced mood disorder: Secondary | ICD-10-CM

## 2017-10-15 DIAGNOSIS — F1910 Other psychoactive substance abuse, uncomplicated: Secondary | ICD-10-CM

## 2017-10-15 DIAGNOSIS — F332 Major depressive disorder, recurrent severe without psychotic features: Principal | ICD-10-CM

## 2017-10-15 DIAGNOSIS — Z630 Problems in relationship with spouse or partner: Secondary | ICD-10-CM

## 2017-10-15 DIAGNOSIS — G4700 Insomnia, unspecified: Secondary | ICD-10-CM

## 2017-10-15 DIAGNOSIS — F17210 Nicotine dependence, cigarettes, uncomplicated: Secondary | ICD-10-CM

## 2017-10-15 DIAGNOSIS — F419 Anxiety disorder, unspecified: Secondary | ICD-10-CM

## 2017-10-15 DIAGNOSIS — R45851 Suicidal ideations: Secondary | ICD-10-CM

## 2017-10-15 DIAGNOSIS — R450 Nervousness: Secondary | ICD-10-CM

## 2017-10-15 LAB — TSH: TSH: 1.557 u[IU]/mL (ref 0.350–4.500)

## 2017-10-15 LAB — HEMOGLOBIN A1C
HEMOGLOBIN A1C: 4.9 % (ref 4.8–5.6)
MEAN PLASMA GLUCOSE: 93.93 mg/dL

## 2017-10-15 LAB — LIPID PANEL
CHOLESTEROL: 168 mg/dL (ref 0–200)
HDL: 63 mg/dL (ref >40)
LDL Cholesterol: 82 mg/dL (ref 0–99)
Total CHOL/HDL Ratio: 2.7 RATIO
Triglycerides: 117 mg/dL (ref <150)
VLDL: 23 mg/dL (ref 0–40)

## 2017-10-15 MED ORDER — FLUOXETINE HCL 10 MG PO CAPS
10.0000 mg | ORAL_CAPSULE | Freq: Every day | ORAL | Status: DC
  Administered 2017-10-16 – 2017-10-18 (×3): 10 mg via ORAL
  Filled 2017-10-15: qty 7
  Filled 2017-10-15 (×4): qty 1
  Filled 2017-10-15: qty 7
  Filled 2017-10-15: qty 1

## 2017-10-15 MED ORDER — NICOTINE POLACRILEX 2 MG MT GUM
2.0000 mg | CHEWING_GUM | OROMUCOSAL | Status: DC | PRN
  Administered 2017-10-15 – 2017-10-18 (×9): 2 mg via ORAL
  Filled 2017-10-15 (×4): qty 1

## 2017-10-15 MED ORDER — GABAPENTIN 100 MG PO CAPS
200.0000 mg | ORAL_CAPSULE | Freq: Two times a day (BID) | ORAL | Status: DC
  Administered 2017-10-15 – 2017-10-18 (×7): 200 mg via ORAL
  Filled 2017-10-15 (×2): qty 2
  Filled 2017-10-15: qty 28
  Filled 2017-10-15 (×2): qty 2
  Filled 2017-10-15: qty 28
  Filled 2017-10-15 (×2): qty 2
  Filled 2017-10-15: qty 28
  Filled 2017-10-15 (×2): qty 2
  Filled 2017-10-15: qty 28
  Filled 2017-10-15 (×4): qty 2

## 2017-10-15 MED ORDER — NICOTINE POLACRILEX 2 MG MT GUM
CHEWING_GUM | OROMUCOSAL | Status: AC
  Administered 2017-10-15: 2 mg via ORAL
  Filled 2017-10-15: qty 1
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
## 2017-10-15 NOTE — Progress Notes (Signed)
D: Pt was in the hallway upon initial approach.  Pt presents with labile affect and mood.  He was initially calm and smiling while talking to Clinical research associatewriter.  Later, he was on the phone and was anxious and irritable, talking loudly to person on the phone while using profanity.  He describes his day as "wonderful, I got to see my son."  Pt denies SI/HI, denies hallucinations, denies pain.  Pt has been visible in milieu interacting with peers and staff appropriately.  Pt attended evening group.    A: Introduced self to pt.  Actively listened to pt and offered support and encouragement. Medications administered per order.  PRN medication administered for anxiety and sleep.  Q15 minute safety checks maintained.  R: Pt is safe on the unit.  Pt is compliant with medications.  Pt verbally contracts for safety.  Will continue to monitor and assess.

## 2017-10-15 NOTE — Progress Notes (Signed)
D Writer requested by coworker to speak to patient and assess for need of prn, saying " I think he's anxious". Writer spoke with pt and observed pt standing at the med window and he bounces / stands from one foot and then to the other. He says " yeah..I'm a little nervous I just got off the phone with my brother...". Writer says " I smoke pot but I;m coming off of meth and cocaine". A Writer started on neurontin 200 mg po bid per MD order. Writer given prn dose of liibrium for anxiety , restlessness, increased physical movement, rapid speech and trmulousness. Writer educated pt about reason for both meds and eh states he understands verbally . No withdrawal complication identified by this Clinical research associatewriter. R Safety cont

## 2017-10-15 NOTE — Progress Notes (Signed)
D patient is observed by writer OOB UAL on the 300 hall this morning. He endorses a flat, depressed affect. A HE completes his daily assessment and on this he writes  He denies having SI today and he rates his depression, hopelessness and anxiety " 3/0/0/", respectively. R Safety in place. POC includes pt being interviewed by NP as well as MD today .

## 2017-10-15 NOTE — Progress Notes (Signed)
Patient did attend the evening speaker AA meeting.  

## 2017-10-15 NOTE — [facility] Group Notes (Signed)
[facility] LCSW Group Therapy Note  Date/Time:  10/15/2017 9:00-10:00AM  Type of Therapy and Topic:  Group Therapy:  Healthy and Unhealthy Supports  Participation Level:  Active   Description of Group:  Patients in this group were introduced to the idea of adding a variety of healthy supports to address the various needs in their lives. The picture on the front of Sunday's workbook was used to demonstrate why more supports are needed in every patient's life.  Patients identified and described healthy supports versus unhealthy supports in general, then gave examples of each in their own lives.   They discussed what additional healthy supports could be helpful in their recovery and wellness after discharge in order to prevent future hospitalizations.   An emphasis was placed on using counselor, doctor, therapy groups, 12-step groups, and problem-specific support groups to expand supports.  They also worked as a group on developing a specific plan for several patients to deal with unhealthy supports through boundary-setting, psychoeducation with loved ones, and even termination of relationships.   Therapeutic Goals:   1)  discuss importance of adding supports to stay well once out of the hospital  2)  compare healthy versus unhealthy supports and identify some examples of each  3)  generate ideas and descriptions of healthy supports that can be added  4)  offer mutual support about how to address unhealthy supports  5)  encourage active participation in and adherence to discharge plan    Summary of Patient Progress: The patient expressed a willingness to add a counselor to help in his recovery journey in 2019, as well as to work on reducing or eliminating his own negative self-support because of his self-hatred and search for escape to drugs in his life this year to better pursue wellness.  His participation was good and he showed some ability to grow his insight.   Therapeutic Modalities:    Motivational Interviewing Brief Solution-Focused Therapy  Ambrose MantleMareida Grossman-Orr, LCSW

## 2017-10-15 NOTE — H&P (Signed)
Psychiatric Admission Assessment Adult  Patient Identification: Caleb SchickCameron Ellis MRN:  [id number] Date of Evaluation:  10/15/2017 Chief Complaint:  MDD without psychotic features  Cannabis use disorder  Principal Diagnosis: Severe recurrent major depression without psychotic features (HCC) Diagnosis:   Patient Active Problem List   Diagnosis Date Noted  . Severe recurrent major depression without psychotic features (HCC) [F33.2] 10/14/2017   History of Present Illness:  Per assessment  note- Caleb Ellis is an 27 y.o. Ellis that presents this date voluntary with thoughts of self harm with a plan to jump off a bridge or parking deck. Patient is observed to be very tearful as he interacts with this Clinical research associatewriter. Patient reports that he had a verbal altercation earlier this date with his partner of three years over child care. Patient states they have a 2318 month old child together and partner is threatening to not allow patient to see the child due to recent trespassing charges. Patient states he currently is on unsupervised probation for multiple charges to include simple assault  and trespassing. Patient denies any H/I or AVH and is time/place oriented. Patient reports one prior attempt at self harm in 2016 when he "cut his wrist" and was seen at [facility] but was not admitted inpatient at that time. Patient states he "was not sure if he was trying to kill himself" at that time. Patient stated he "just wanted to show his girlfriend what he is willing to do." Patient stated he was remorseful in reference to that incident and was diagnosed with depression at that time by that provider. Patient stated that he "might have been on medications" but cannot recall what those medications were. Patient denies any other OP care or inpatient admissions. Per record review patient was seen at [facility] in 2013 for some S/I but was not admitted. Patient reports ongoing depression with symptoms to include: isolating, guilt and feelings  of worthlessness. Patient reports ongoing SA use of Cannabis stating he uses up to 1 gram a day for the last year with last use earlier this date when patient reported using 1 gram. Patient denies any other SA use. Per notes, patient  presents to the Emergency Department brought in by [employer] after his friend reports he was experiencing SI. Patient has a history of bipolar disorder per note review and anger outbursts. Case was staffed with Arville CareParks FNP who recommended a inpatient admission as appropriate bed placement is investigated.   ON Evaluation:  Caleb Ellis is awake, alert and oriented. Patient seen standing at the nursing stations. validates information that was provided in the above assessment. Continues to report passive suicidal ideation if his girlfriend will not take him back. Reports " I need to due better for my kids and ex girlfriend. Patient is able to contract for safety. Patient reports I need to be restarted on my medications however is unable to recall the names of medications.   Denies auditory or visual hallucination and does not appear to be responding to internal stimuli Support, encouragement and reassurance was provided.    Associated Signs/Symptoms: Depression Symptoms:  depressed mood, feelings of worthlessness/guilt, difficulty concentrating, (Hypo) Manic Symptoms:  Distractibility, Anxiety Symptoms:  Excessive Worry, Psychotic Symptoms:  Hallucinations: None PTSD Symptoms: Avoidance:  Decreased Interest/Participation Total Time spent with patient: 20 minutes  Past Psychiatric History:  Is the patient at risk to self? Yes.    Has the patient been a risk to self in the past 6 months? Yes.    Has the patient been  a risk to self within the distant past? Yes.    Is the patient a risk to others? No.  Has the patient been a risk to others in the past 6 months? No.  Has the patient been a risk to others within the distant past? No.   Prior Inpatient Therapy:   Prior  Outpatient Therapy:    Alcohol Screening: 1. How often do you have a drink containing alcohol?: 2 to 4 times a month 2. How many drinks containing alcohol do you have on a typical day when you are drinking?: 7, 8, or 9 3. How often do you have six or more drinks on one occasion?: Monthly AUDIT-C Score: 7 4. How often during the last year have you found that you were not able to stop drinking once you had started?: Never 5. How often during the last year have you failed to do what was normally expected from you becasue of drinking?: Less than monthly 6. How often during the last year have you needed a first drink in the morning to get yourself going after a heavy drinking session?: Never 7. How often during the last year have you had a feeling of guilt of remorse after drinking?: Never 8. How often during the last year have you been unable to remember what happened the night before because you had been drinking?: Less than monthly 9. Have you or someone else been injured as a result of your drinking?: No 10. Has a relative or friend or a doctor or another health worker been concerned about your drinking or suggested you cut down?: No Alcohol Use Disorder Identification Test Final Score (AUDIT): 9 Intervention/Follow-up: Alcohol Education, Brief Advice Substance Abuse History in the last 12 months:  Yes.   Consequences of Substance Abuse: Withdrawal Symptoms:   Headaches Tremors Previous Psychotropic Medications: YES Psychological Evaluations: YES Past Medical History:  Past Medical History:  Diagnosis Date  . ADHD (attention deficit hyperactivity disorder)   . Bipolar disorder (HCC)   . Outbursts of anger    History reviewed. No pertinent surgical history. Family History: History reviewed. No pertinent family history. Family Psychiatric  History:  Tobacco Screening: Have you used any form of tobacco in the last 30 days? (Cigarettes, Smokeless Tobacco, Cigars, and/or Pipes): Yes Tobacco  use, Select all that apply: 5 or more cigarettes per day Are you interested in Tobacco Cessation Medications?: Yes, will notify MD for an order Counseled patient on smoking cessation including recognizing danger situations, developing coping skills and basic information about quitting provided: Refused/Declined practical counseling Social History:  Social History   Substance and Sexual Activity  Alcohol Use Yes   Comment: occ     Social History   Substance and Sexual Activity  Drug Use Yes  . Types: Marijuana, Cocaine, Methamphetamines   Comment: "alot", pills, Molly    Additional Social History:      Pain Medications: denies Prescriptions: denies Over the Counter: denies History of alcohol / drug use?: Yes Longest period of sobriety (when/how long): unknown Negative Consequences of Use: Personal relationships Withdrawal Symptoms: Other (Comment)(denies) Name of Substance 1: marijuana 1 - Age of First Use: 21 1 - Amount (size/oz): varies 1 - Frequency: daily 1 - Duration: on-going 1 - Last Use / Amount: today Name of Substance 2: meth 2 - Age of First Use: 26 2 - Amount (size/oz): varies 2 - Frequency: rarely 2 - Duration: within the past year 2 - Last Use / Amount: "last night" "snorted a  line"  Name of Substance 3: cocaine 3 - Age of First Use: 23 3 - Amount (size/oz): 1 gram 3 - Frequency: rarely 3 - Duration: on-going 3 - Last Use / Amount: "a week ago"               Allergies:  No Known Allergies Lab Results:  Results for orders placed or performed during the hospital encounter of 10/14/17 (from the past 48 hour(s))  Hemoglobin A1c     Status: None   Collection Time: 10/15/17  6:38 AM  Result Value Ref Range   Hgb A1c MFr Bld 4.9 4.8 - 5.6 %    Comment: (NOTE) Pre diabetes:          5.7%-6.4% Diabetes:              >6.4% Glycemic control for   <7.0% adults with diabetes    Mean Plasma Glucose 93.93 mg/dL    Comment: Performed at [facility], [address], [place], [place] [address]  Lipid panel     Status: None   Collection Time: 10/15/17  6:38 AM  Result Value Ref Range   Cholesterol 168 0 - 200 mg/dL   Triglycerides 604 <540 mg/dL   HDL 63 >98 mg/dL   Total CHOL/HDL Ratio 2.7 RATIO   VLDL 23 0 - 40 mg/dL   LDL Cholesterol 82 0 - 99 mg/dL    Comment:        Total Cholesterol/HDL:CHD Risk Coronary Heart Disease Risk Table                     Men   Women  1/2 Average Risk   3.4   3.3  Average Risk       5.0   4.4  2 X Average Risk   9.6   7.1  3 X Average Risk  23.4   11.0        Use the calculated Patient Ratio above and the CHD Risk Table to determine the patient's CHD Risk.        ATP III CLASSIFICATION (LDL):  <100     mg/dL   Optimal  119-147  mg/dL   Near or Above                    Optimal  130-159  mg/dL   Borderline  829-562  mg/dL   High  >130     mg/dL   Very High Performed at [facility], [address], [place], [place] [address]   TSH     Status: None   Collection Time: 10/15/17  6:38 AM  Result Value Ref Range   TSH 1.557 0.350 - 4.500 uIU/mL    Comment: Performed by a 3rd Generation assay with a functional sensitivity of <=0.01 uIU/mL. Performed at [facility], [address], [place], [place] [address]     Blood Alcohol level:  Lab Results  Component Value Date   [facility] <11 05/14/2013   ETH <11 04/13/2013    Metabolic Disorder Labs:  Lab Results  Component Value Date   HGBA1C 4.9 10/15/2017   MPG 93.93 10/15/2017   No results found for: PROLACTIN Lab Results  Component Value Date   CHOL 168 10/15/2017   TRIG 117 10/15/2017   HDL 63 10/15/2017   CHOLHDL 2.7 10/15/2017   VLDL 23 10/15/2017   LDLCALC 82 10/15/2017    Current Medications: Current Facility-Administered Medications  Medication Dose Route Frequency Provider Last Rate Last Dose  . acetaminophen (TYLENOL) tablet 650 mg  650 mg Oral Q6H PRN Nira Conn A, NP      .  alum & mag hydroxide-simeth (MAALOX/MYLANTA) 200-200-20 MG/5ML suspension 30 mL  30 mL Oral Q4H PRN Nira Conn A, NP      . chlordiazePOXIDE (LIBRIUM) capsule 25 mg  25 mg Oral Q6H PRN Nira Conn A, NP   25 mg at 10/15/17 1044  . gabapentin (NEURONTIN) capsule 200 mg  200 mg Oral BID Oneta Rack, NP   200 mg at 10/15/17 1044  . hydrOXYzine (ATARAX/VISTARIL) tablet 25 mg  25 mg Oral TID PRN Jackelyn Poling, NP   25 mg at 10/14/17 2152  . loperamide (IMODIUM) capsule 2-4 mg  2-4 mg Oral PRN Nira Conn A, NP      . magnesium hydroxide (MILK OF MAGNESIA) suspension 30 mL  30 mL Oral Daily PRN Nira Conn A, NP      . nicotine polacrilex (NICORETTE) gum 2 mg  2 mg Oral PRN Izediuno, Delight Ovens, MD   2 mg at 10/15/17 1045  . ondansetron (ZOFRAN-ODT) disintegrating tablet 4 mg  4 mg Oral Q6H PRN Nira Conn A, NP      . traZODone (DESYREL) tablet 50 mg  50 mg Oral QHS PRN Nira Conn A, NP   50 mg at 10/14/17 2152   PTA Medications: No medications prior to admission.    Musculoskeletal: Strength & Muscle Tone: within normal limits Gait & Station: normal Patient leans: N/A  Psychiatric Specialty Exam: Physical Exam  Nursing note and vitals reviewed. Constitutional: He is oriented to person, place, and time.  Neurological: He is alert and oriented to person, place, and time.  Psychiatric: He has a normal mood and affect. His behavior is normal.    Review of Systems  Psychiatric/Behavioral: Positive for depression and substance abuse. The patient is nervous/anxious and has insomnia.     Blood pressure 119/73, pulse 83, temperature 97.7 F (36.5 C), temperature source Oral, resp. rate 20, height 5\' 8"  (1.727 m), weight 64.9 kg (143 lb).Body mass index is 21.74 kg/m.  General Appearance: Casual  Eye Contact:  Fair  Speech:  Clear and Coherent  Volume:  Normal  Mood:  Anxious and Depressed  Affect:  Congruent  Thought Process:  Coherent  Orientation:  Full (Time, Place, and  Person)  Thought Content:  Hallucinations: None  Suicidal Thoughts:  Yes.  with intent/plan  Homicidal Thoughts:  No  Memory:  Immediate;   Fair Recent;   Fair Remote;   Fair  Judgement:  Fair  Insight:  Present  Psychomotor Activity:  Normal  Concentration:  Concentration: Fair  Recall:  Good  Fund of Knowledge:  Fair  Language:  Fair  Akathisia:  No  Handed:  Right  AIMS (if indicated):     Assets:  Communication Skills Desire for Improvement Resilience Social Support  ADL's:  Intact  Cognition:  WNL  Sleep:  Number of Hours: 6.25    Treatment Plan Summary: Daily contact with patient to assess and evaluate symptoms and progress in treatment and Medication management    Initiate Prozac 10 mg PO QD and  Neurontin 200 mg PO BID  for mood stabilization. Continue with Trazodone 100 mg for insomnia Started on CWIA/ Librium Protocol Will continue to monitor vitals ,medication compliance and treatment side effects while patient is here.  Reviewed labs: TSH 1.557 A1C: 4.9  ,BAL - ,  UDS - positive cocaine, thc and amphetamines   CSW will start working on disposition.  Patient to participate in therapeutic milieu   Observation Level/Precautions:  15 minute checks  Laboratory:  CBC Chemistry Profile HbAIC HCG UDS  Psychotherapy:  Individual and group session  Medications:  See SRA by MD  Consultations:  CSW and Psychiatry   Discharge Concerns:  Safety, stabilization, and risk of access to medication and medication stabilization   Estimated LOS: 5-7days  Other:     Physician Treatment Plan for Primary Diagnosis: Severe recurrent major depression without psychotic features (HCC) Long Term Goal(s): Improvement in symptoms so as ready for discharge  Short Term Goals: Ability to identify changes in lifestyle to reduce recurrence of condition will improve, Ability to verbalize feelings will improve, Ability to identify and develop effective coping behaviors will improve and  Compliance with prescribed medications will improve  Physician Treatment Plan for Secondary Diagnosis: Principal Problem:   Severe recurrent major depression without psychotic features (HCC)  Long Term Goal(s): Improvement in symptoms so as ready for discharge  Short Term Goals: Ability to identify changes in lifestyle to reduce recurrence of condition will improve, Ability to identify and develop effective coping behaviors will improve, Ability to maintain clinical measurements within normal limits will improve, Compliance with prescribed medications will improve and Ability to identify triggers associated with substance abuse/mental health issues will improve  I certify that inpatient services furnished can reasonably be expected to improve the patient's condition.    Oneta Rack, NP 1/6/201912:16 PM

## 2017-10-16 DIAGNOSIS — Z79899 Other long term (current) drug therapy: Secondary | ICD-10-CM

## 2017-10-16 DIAGNOSIS — F1994 Other psychoactive substance use, unspecified with psychoactive substance-induced mood disorder: Secondary | ICD-10-CM

## 2017-10-16 NOTE — [facility] Suicide Risk Assessment (Signed)
[facility] INPATIENT:  Family/Significant Other Suicide Prevention Education  Suicide Prevention Education:  Patient Refusal for Family/Significant Other Suicide Prevention Education: The patient Caleb Ellis has refused to provide written consent for family/significant other to be provided Family/Significant Other Suicide Prevention Education during admission and/or prior to discharge.  Physician notified.  Caleb Ellis, Caleb Ellis 10/16/2017, 1:50 PM

## 2017-10-16 NOTE — Tx Team (Signed)
Interdisciplinary Treatment and Diagnostic Plan Update  10/16/2017 Time of Session: 0830AM Caleb Ellis MRN: [id number]  Principal Diagnosis: Severe recurrent major depression without psychotic features [facility])  Secondary Diagnoses: Principal Problem:   Severe recurrent major depression without psychotic features (HCC) Active Problems:   Substance induced mood disorder (HCC)   Current Medications:  Current Facility-Administered Medications  Medication Dose Route Frequency Provider Last Rate Last Dose  . acetaminophen (TYLENOL) tablet 650 mg  650 mg Oral Q6H PRN Nira Conn A, NP      . alum & mag hydroxide-simeth (MAALOX/MYLANTA) 200-200-20 MG/5ML suspension 30 mL  30 mL Oral Q4H PRN Nira Conn A, NP      . chlordiazePOXIDE (LIBRIUM) capsule 25 mg  25 mg Oral Q6H PRN Nira Conn A, NP   25 mg at 10/15/17 1044  . FLUoxetine (PROZAC) capsule 10 mg  10 mg Oral Daily Oneta Rack, NP      . gabapentin (NEURONTIN) capsule 200 mg  200 mg Oral BID Oneta Rack, NP   200 mg at 10/15/17 1633  . hydrOXYzine (ATARAX/VISTARIL) tablet 25 mg  25 mg Oral TID PRN Jackelyn Poling, NP   25 mg at 10/15/17 2119  . loperamide (IMODIUM) capsule 2-4 mg  2-4 mg Oral PRN Nira Conn A, NP      . magnesium hydroxide (MILK OF MAGNESIA) suspension 30 mL  30 mL Oral Daily PRN Nira Conn A, NP      . nicotine polacrilex (NICORETTE) gum 2 mg  2 mg Oral PRN Georgiann Cocker, MD   2 mg at 10/15/17 1635  . ondansetron (ZOFRAN-ODT) disintegrating tablet 4 mg  4 mg Oral Q6H PRN Nira Conn A, NP      . traZODone (DESYREL) tablet 50 mg  50 mg Oral QHS PRN Jackelyn Poling, NP   50 mg at 10/15/17 2119   PTA Medications: No medications prior to admission.    Patient Stressors: Paediatric nurse issue Occupational concerns Substance abuse Other: break up with fiance, kicked out of home  Patient Strengths: Active sense of humor Average or above average intelligence Communication  skills General fund of knowledge  Treatment Modalities: Medication Management, Group therapy, Case management,  1 to 1 session with clinician, Psychoeducation, Recreational therapy.   Physician Treatment Plan for Primary Diagnosis: Severe recurrent major depression without psychotic features (HCC) Long Term Goal(s): Improvement in symptoms so as ready for discharge Improvement in symptoms so as ready for discharge   Short Term Goals: Ability to identify changes in lifestyle to reduce recurrence of condition will improve Ability to verbalize feelings will improve Ability to identify and develop effective coping behaviors will improve Compliance with prescribed medications will improve Ability to identify changes in lifestyle to reduce recurrence of condition will improve Ability to identify and develop effective coping behaviors will improve Ability to maintain clinical measurements within normal limits will improve Compliance with prescribed medications will improve Ability to identify triggers associated with substance abuse/mental health issues will improve  Medication Management: Evaluate patient's response, side effects, and tolerance of medication regimen.  Therapeutic Interventions: 1 to 1 sessions, Unit Group sessions and Medication administration.  Evaluation of Outcomes: Progressing  Physician Treatment Plan for Secondary Diagnosis: Principal Problem:   Severe recurrent major depression without psychotic features (HCC) Active Problems:   Substance induced mood disorder (HCC)  Long Term Goal(s): Improvement in symptoms so as ready for discharge Improvement in symptoms so as ready for discharge   Short Term Goals:  Ability to identify changes in lifestyle to reduce recurrence of condition will improve Ability to verbalize feelings will improve Ability to identify and develop effective coping behaviors will improve Compliance with prescribed medications will  improve Ability to identify changes in lifestyle to reduce recurrence of condition will improve Ability to identify and develop effective coping behaviors will improve Ability to maintain clinical measurements within normal limits will improve Compliance with prescribed medications will improve Ability to identify triggers associated with substance abuse/mental health issues will improve     Medication Management: Evaluate patient's response, side effects, and tolerance of medication regimen.  Therapeutic Interventions: 1 to 1 sessions, Unit Group sessions and Medication administration.  Evaluation of Outcomes: Progressing   RN Treatment Plan for Primary Diagnosis: Severe recurrent major depression without psychotic features (HCC) Long Term Goal(s): Knowledge of disease and therapeutic regimen to maintain health will improve  Short Term Goals: Ability to remain free from injury will improve, Ability to disclose and discuss suicidal ideas and Ability to identify and develop effective coping behaviors will improve  Medication Management: RN will administer medications as ordered by provider, will assess and evaluate patient's response and provide education to patient for prescribed medication. RN will report any adverse and/or side effects to prescribing provider.  Therapeutic Interventions: 1 on 1 counseling sessions, Psychoeducation, Medication administration, Evaluate responses to treatment, Monitor vital signs and CBGs as ordered, Perform/monitor CIWA, COWS, AIMS and Fall Risk screenings as ordered, Perform wound care treatments as ordered.  Evaluation of Outcomes: Progressing   LCSW Treatment Plan for Primary Diagnosis: Severe recurrent major depression without psychotic features (HCC) Long Term Goal(s): Safe transition to appropriate next level of care at discharge, Engage patient in therapeutic group addressing interpersonal concerns.  Short Term Goals: Engage patient in aftercare  planning with referrals and resources, Facilitate patient progression through stages of change regarding substance use diagnoses and concerns and Identify triggers associated with mental health/substance abuse issues  Therapeutic Interventions: Assess for all discharge needs, 1 to 1 time with Social worker, Explore available resources and support systems, Assess for adequacy in community support network, Educate family and significant other(s) on suicide prevention, Complete Psychosocial Assessment, Interpersonal group therapy.  Evaluation of Outcomes: Progressing   Progress in Treatment: Attending groups: No.  New to unit. Continuing to assess.  Participating in groups: No. Taking medication as prescribed: Yes. Toleration medication: Yes. Family/Significant other contact made: No, will contact:  family member if patient consents.  Patient understands diagnosis: Yes. Discussing patient identified problems/goals with staff: Yes. Medical problems stabilized or resolved: Yes. Denies suicidal/homicidal ideation: Yes. Issues/concerns per patient self-inventory: No. Other: n/a   New problem(s) identified: No, Describe:  n/a  New Short Term/Long Term Goal(s): detox, medication management for mood stabilization, development of comprehensive mental wellness/sobriety plan.   Discharge Plan or Barriers: CSW assessing. Pt has no current outpatient providers. MHAG pamphlet and AA/NA list for [place] provided to pt for additional community supports.   Reason for Continuation of Hospitalization: Anxiety Depression Medication stabilization Suicidal ideation Withdrawal symptoms  Estimated Length of Stay: Wed, 10/18/17  Attendees: Patient: 10/16/2017 11:39 AM  Physician: Dr. Altamese Crawford MD; Dr. Jackquline Berlin MD 10/16/2017 11:39 AM  Nursing: Boyd Kerbs RN; Christa RN 10/16/2017 11:39 AM  RN Care Manager: x 10/16/2017 11:39 AM  Social Worker: Trula Slade, LCSW 10/16/2017 11:39 AM  Recreational Therapist: x  10/16/2017 11:39 AM  Other: Hillery Jacks NP; Feliz Beam Money NP 10/16/2017 11:39 AM  Other:  10/16/2017 11:39 AM  Other: 10/16/2017 11:39  AM    Scribe for Treatment Team: Lew DawesHeather N Smart, LCSW 10/16/2017 11:39 AM

## 2017-10-16 NOTE — [facility] Group Notes (Signed)
[facility] 10/16/2017 1:15pm  Type of Therapy: Mental Health Association Presentation  Participation Level: Active  Participation Quality: Attentive  Affect: Appropriate  Cognitive: Oriented  Insight: Developing/Improving  Engagement in Therapy: Engaged  Modes of Intervention: Discussion, Education and Socialization  Summary of Progress/Problems: Mental Health Association (MHA) Speaker came to talk about his personal journey with mental health. The pt processed ways by which to relate to the speaker. MHA speaker provided handouts and educational information pertaining to groups and services offered by the [facility]. Pt was engaged in speaker's presentation and was receptive to resources provided.    Pulte HomesHeather N Smart, LCSW 10/16/2017 2:24 PM
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
## 2017-10-16 NOTE — Progress Notes (Signed)
Recreation Therapy Notes  Date: 10/16/17 Time: 0930 Location: 300 Hall Dayroom  Group Topic: Stress Management  Goal Area(s) Addresses:  Patient will verbalize importance of using healthy stress management.  Patient will identify positive emotions associated with healthy stress management.   Intervention: Stress Management  Activity :  LRT introduced the stress management technique of meditation.  LRT played a meditation to help patients deal with anxiety.  Patients were to follow along to fully engage in the meditation.  Education:  Stress Management, Discharge Planning.   Education Outcome: Acknowledges edcuation/In group clarification offered/Needs additional education  Clinical Observations/Feedback: Pt did not attend group.    Caroll RancherMarjette Donielle Radziewicz, LRT/CTRS         Lillia AbedLindsay, Mylene Bow A 10/16/2017 11:18 AM

## 2017-10-16 NOTE — Progress Notes (Addendum)
D: Pt was in the hallway with visitor upon initial approach.  Pt presents with anxious affect and mood.  He has a positive attitude and is optimistic while talking to Clinical research associatewriter.  He reports his day was "wonderful" and he had a "wonderful" visit with his grandfather.  His goal was to "work on my social skills, which I've been doing, doing good."  He reports he may discharge tomorrow or Wednesday and he feels safe to do so.  He states "I just really want to go home tomorrow."  He reports "the medications are doing a great job, I feel a lot better."  He plans to "stick to being off drugs."  Peer on the unit was targeting pt without provocation.  Pt removed himself from situation and went to his room.  Pt denies SI/HI, denies hallucinations, denies pain.  Pt has been visible in milieu interacting with peers and staff appropriately.  Pt was excused from evening group since peer was targeting him.   A: Introduced self to pt.  Actively listened to pt and offered support and encouragement.  PRN medication administered for anxiety and sleep.  Praised pt for removing himself from situation when peer was verbally aggressive to him.  Pt was offered to move to 400 hall and he declined, reporting he would stay in his room tonight.  AC notified.  Q15 minute safety checks maintained.  R: Pt is safe on the unit.  Pt is compliant with medications.  He reports he will inform staff of needs and concerns.  Pt verbally contracts for safety.  Will continue to monitor and assess.

## 2017-10-16 NOTE — Plan of Care (Signed)
  Progressing Safety: Periods of time without injury will increase 10/16/2017 2157 - Progressing by Arrie Aranhurch, Francesca Strome J, RN Note Pt has not harmed self or others tonight.  He denies SI/HI and verbally contracts for safety.

## 2017-10-16 NOTE — Progress Notes (Addendum)
Data. Patient denies SI/HI/AVH. Verbally contracts for safety on the unit and to come to staff before acting of any self harm thoughts/feelings. Patient slept late and refused to get up for morning medications stating, "I slept poorly and I feel sleepy." Patient's affect is flat, but brightens with interaction.  Patient interacting well with staff and other patients. Acting silly on the unit and pretnding to be a chicken and making clucking noises around the unit, "For fun." Was able to be redirected appropriately. On his self assessment he reports 0/10 for depression and hopelessness and 2/10 for anxiety. His goal for today is, "Social skills.". Reports minimal detox symptoms today.   Action. Emotional support and encouragement offered. Education provided on medication, indications and side effect. Q 15 minute checks done for safety. Response. Safety on the unit maintained through 15 minute checks.  Medications taken as prescribed. Attend groups.

## 2017-10-16 NOTE — Progress Notes (Signed)
[facility] MD Progress Note  10/16/2017 3:37 PM Dmani Mizer  MRN:  [id number]   Subjective:  Patient reports that he is doing good today and he denies any SI/HI/AVH and contracts for safety. He refuses to go to a rehab facility even though he has a history of relapse and has never been to a rehab facility. He reports that he was using cocaine daily with his girlfriend and he blames her for the reason he keeps using drugs. "She is a stripper so it's easy for her to get." He denies any withdrawal symptoms.   Objective: Patient's chart and findings reviewed and discussed with treatment team. Patient is pleasant and cooperative. Patient seems to blame his relapses on others and doesn't directly take any blame. He feels that he needs to be there for his children because their mother will be going to jail, but the children stay with an aunt and that was where the children stayed while the patient and his girlfriend used drugs.   Principal Problem: Severe recurrent major depression without psychotic features (HCC) Diagnosis:   Patient Active Problem List   Diagnosis Date Noted  . Substance induced mood disorder (HCC) [F19.94] 10/15/2017  . Severe recurrent major depression without psychotic features (HCC) [F33.2] 10/14/2017   Total Time spent with patient: 15 minutes  Past Psychiatric History: See H&P  Past Medical History:  Past Medical History:  Diagnosis Date  . ADHD (attention deficit hyperactivity disorder)   . Bipolar disorder (HCC)   . Outbursts of anger    History reviewed. No pertinent surgical history. Family History: History reviewed. No pertinent family history. Family Psychiatric  History: See H&P Social History:  Social History   Substance and Sexual Activity  Alcohol Use Yes   Comment: occ     Social History   Substance and Sexual Activity  Drug Use Yes  . Types: Marijuana, Cocaine, Methamphetamines   Comment: "alot", pills, Molly    Social History   Socioeconomic  History  . Marital status: Single    Spouse name: None  . Number of children: None  . Years of education: None  . Highest education level: None  Social Needs  . Financial resource strain: None  . Food insecurity - worry: None  . Food insecurity - inability: None  . Transportation needs - medical: None  . Transportation needs - non-medical: None  Occupational History  . None  Tobacco Use  . Smoking status: Current Every Day Smoker    Packs/day: 0.50    Types: Cigarettes  . Smokeless tobacco: Never Used  Substance and Sexual Activity  . Alcohol use: Yes    Comment: occ  . Drug use: Yes    Types: Marijuana, Cocaine, Methamphetamines    Comment: "alot", pills, Molly  . Sexual activity: No  Other Topics Concern  . None  Social History Narrative  . None   Additional Social History:    Pain Medications: denies Prescriptions: denies Over the Counter: denies History of alcohol / drug use?: Yes Longest period of sobriety (when/how long): unknown Negative Consequences of Use: Personal relationships Withdrawal Symptoms: Other (Comment)(denies) Name of Substance 1: marijuana 1 - Age of First Use: 21 1 - Amount (size/oz): varies 1 - Frequency: daily 1 - Duration: on-going 1 - Last Use / Amount: today Name of Substance 2: meth 2 - Age of First Use: 26 2 - Amount (size/oz): varies 2 - Frequency: rarely 2 - Duration: within the past year 2 - Last Use / Amount: "last  night" "snorted a line"  Name of Substance 3: cocaine 3 - Age of First Use: 23 3 - Amount (size/oz): 1 gram 3 - Frequency: rarely 3 - Duration: on-going 3 - Last Use / Amount: "a week ago"               Sleep: Good  Appetite:  Good  Current Medications: Current Facility-Administered Medications  Medication Dose Route Frequency Provider Last Rate Last Dose  . acetaminophen (TYLENOL) tablet 650 mg  650 mg Oral Q6H PRN Nira Conn A, NP      . alum & mag hydroxide-simeth (MAALOX/MYLANTA) 200-200-20  MG/5ML suspension 30 mL  30 mL Oral Q4H PRN Nira Conn A, NP      . chlordiazePOXIDE (LIBRIUM) capsule 25 mg  25 mg Oral Q6H PRN Nira Conn A, NP   25 mg at 10/15/17 1044  . FLUoxetine (PROZAC) capsule 10 mg  10 mg Oral Daily Oneta Rack, NP   10 mg at 10/16/17 1210  . gabapentin (NEURONTIN) capsule 200 mg  200 mg Oral BID Oneta Rack, NP   200 mg at 10/16/17 1210  . hydrOXYzine (ATARAX/VISTARIL) tablet 25 mg  25 mg Oral TID PRN Jackelyn Poling, NP   25 mg at 10/15/17 2119  . loperamide (IMODIUM) capsule 2-4 mg  2-4 mg Oral PRN Nira Conn A, NP      . magnesium hydroxide (MILK OF MAGNESIA) suspension 30 mL  30 mL Oral Daily PRN Nira Conn A, NP      . nicotine polacrilex (NICORETTE) gum 2 mg  2 mg Oral PRN Izediuno, Delight Ovens, MD   2 mg at 10/16/17 1254  . ondansetron (ZOFRAN-ODT) disintegrating tablet 4 mg  4 mg Oral Q6H PRN Jackelyn Poling, NP      . traZODone (DESYREL) tablet 50 mg  50 mg Oral QHS PRN Jackelyn Poling, NP   50 mg at 10/15/17 2119    Lab Results:  Results for orders placed or performed during the hospital encounter of 10/14/17 (from the past 48 hour(s))  Hemoglobin A1c     Status: None   Collection Time: 10/15/17  6:38 AM  Result Value Ref Range   Hgb A1c MFr Bld 4.9 4.8 - 5.6 %    Comment: (NOTE) Pre diabetes:          5.7%-6.4% Diabetes:              >6.4% Glycemic control for   <7.0% adults with diabetes    Mean Plasma Glucose 93.93 mg/dL    Comment: Performed at [facility], [address] [place], [place] [address]  Lipid panel     Status: None   Collection Time: 10/15/17  6:38 AM  Result Value Ref Range   Cholesterol 168 0 - 200 mg/dL   Triglycerides 914 <782 mg/dL   HDL 63 >95 mg/dL   Total CHOL/HDL Ratio 2.7 RATIO   VLDL 23 0 - 40 mg/dL   LDL Cholesterol 82 0 - 99 mg/dL    Comment:        Total Cholesterol/HDL:CHD Risk Coronary Heart Disease Risk Table                     Men   Women  1/2 Average Risk   3.4   3.3  Average Risk        5.0   4.4  2 X Average Risk   9.6   7.1  3  X Average Risk  23.4   11.0        Use the calculated Patient Ratio above and the CHD Risk Table to determine the patient's CHD Risk.        ATP III CLASSIFICATION (LDL):  <100     mg/dL   Optimal  [id number]  mg/dL   Near or Above                    Optimal  130-159  mg/dL   Borderline  [phone]  mg/dL   High  >272>190     mg/dL   Very High Performed at [facility], [address] [place], [place] [address]   TSH     Status: None   Collection Time: 10/15/17  6:38 AM  Result Value Ref Range   TSH 1.557 0.350 - 4.500 uIU/mL    Comment: Performed by a 3rd Generation assay with a functional sensitivity of <=0.01 uIU/mL. Performed at [facility], [address] [place], [place] [address]     Blood Alcohol level:  Lab Results  Component Value Date   [facility]14[facility]   ETH <11 04/13/2013    Metabolic Disorder Labs: Lab Results  Component Value Date   HGBA1C 4.9 10/15/2017   MPG 93.93 10/15/2017   No results found for: PROLACTIN Lab Results  Component Value Date   CHOL 168 10/15/2017   TRIG 117 10/15/2017   HDL 63 10/15/2017   CHOLHDL 2.7 10/15/2017   VLDL 23 10/15/2017   LDLCALC 82 10/15/2017    Physical Findings: AIMS: Facial and Oral Movements Muscles of Facial Expression: None, normal Lips and Perioral Area: None, normal Jaw: None, normal Tongue: None, normal,Extremity Movements Upper (arms, wrists, hands, fingers): None, normal Lower (legs, knees, ankles, toes): None, normal, Trunk Movements Neck, shoulders, hips: None, normal, Overall Severity Severity of abnormal movements (highest score from questions above): None, normal Incapacitation due to abnormal movements: None, normal Patient's awareness of abnormal movements (rate only patient's report): No Awareness, Dental Status Current problems with teeth and/or dentures?: No Does patient usually wear dentures?: No  CIWA:   CIWA-Ar Total: 4 COWS:  COWS Total Score: 3  Musculoskeletal: Strength & Muscle Tone: within normal limits Gait & Station: normal Patient leans: N/A  Psychiatric Specialty Exam: Physical Exam  Nursing note and vitals reviewed. Constitutional: He is oriented to person, place, and time. He appears well-developed and well-nourished.  Cardiovascular: Normal rate.  Respiratory: Effort normal.  Musculoskeletal: Normal range of motion.  Neurological: He is alert and oriented to person, place, and time.  Skin: Skin is warm.    Review of Systems  Constitutional: Negative.   HENT: Negative.   Eyes: Negative.   Respiratory: Negative.   Cardiovascular: Negative.   Gastrointestinal: Negative.   Genitourinary: Negative.   Musculoskeletal: Negative.   Skin: Negative.   Neurological: Negative.   Endo/Heme/Allergies: Negative.   Psychiatric/Behavioral: Negative.     Blood pressure 130/60, pulse 67, temperature 98.4 F (36.9 C), temperature source Oral, resp. rate 16, height 5\' 8"  (1.727 m), weight 64.9 kg (143 lb).Body mass index is 21.74 kg/m.  General Appearance: Casual  Eye Contact:  Good  Speech:  Clear and Coherent and Normal Rate  Volume:  Normal  Mood:  Euthymic  Affect:  Appropriate  Thought Process:  Goal Directed and Descriptions of Associations: Intact  Orientation:  Full (Time, Place, and Person)  Thought Content:  WDL  Suicidal Thoughts:  No  Homicidal  Thoughts:  No  Memory:  Immediate;   Good Recent;   Good Remote;   Good  Judgement:  Good  Insight:  Good  Psychomotor Activity:  Normal  Concentration:  Concentration: Good and Attention Span: Good  Recall:  Good  Fund of Knowledge:  Good  Language:  Good  Akathisia:  No  Handed:  Right  AIMS (if indicated):     Assets:  Communication Skills Desire for Improvement Financial Resources/Insurance Housing Physical Health Social Support Transportation  ADL's:  Intact  Cognition:  WNL  Sleep:  Number of Hours:  6   Problems Addressed: MDD severe SUD  Treatment Plan Summary: Daily contact with patient to assess and evaluate symptoms and progress in treatment, Medication management and Plan is to:  -Discontinue Librium -Continue Prozac 10 mg PO Daily for mood stability -Continue Gabapentin 200 mg PO BID for withdrawal symptoms -Continue 25 mg PO TID PRN for anxiety -Continue Trazodone 50 mg PO QHS PRN for insomnia -Encourage group therapy participation  Maryfrances Bunnell, FNP 10/16/2017, 3:37 PM

## 2017-10-17 DIAGNOSIS — F1210 Cannabis abuse, uncomplicated: Secondary | ICD-10-CM

## 2017-10-17 DIAGNOSIS — F1410 Cocaine abuse, uncomplicated: Secondary | ICD-10-CM

## 2017-10-17 NOTE — Plan of Care (Signed)
No self injurious observed or expressed

## 2017-10-17 NOTE — Progress Notes (Signed)
Pt attended NA group this evening.  

## 2017-10-17 NOTE — Progress Notes (Signed)
D: Pt was in his room with visitor upon initial approach.  Pt presents with appropriate affect and mood.  Describes his day as "wonderful" and reports he had a "wonderful" visit with his brother tonight.  He reports he is discharging tomorrow and he feels safe to do so.  Goal is to "work on my temper a little bit."  Pt denies SI/HI, denies hallucinations, denies pain.  Pt has been visible in milieu interacting with peers and staff appropriately.  Pt attended evening group.    A: Introduced self to pt.  Actively listened to pt and offered support and encouragement.  PRN medication administered for sleep.  Q15 minute safety checks maintained.  R: Pt is safe on the unit.  Pt is compliant with medication.  Pt verbally contracts for safety.  Will continue to monitor and assess.

## 2017-10-17 NOTE — Progress Notes (Signed)
[facility] MD Progress Note  10/17/2017 4:29 PM Isack Lavalley  MRN:  [id number]   Subjective:  Patient reports he is feeling better, and states " this is the best I have felt in a while". He is hoping for discharge soon. Denies medication side effects at this time.  Objective:  I have discussed case with treatment team and have met with patient . Patient is a 27 year old male, presented with depression, suicidal ideations.Reports history of substance abuse- admission UDS positive for cannabis, cocaine and amphetamines . As per RN report, patient has improved but remains labile and irritable at this time. At this time presents with full range of affect and reports he is feeling much better than he did on admission, which he attributes at least partly to sobriety. States " I feel clearer now, I can think more clearly". As he improves he is focusing more on discharge and is expressing hope to be discharged soon. States he plans to go live with a friend who is sober and will provide a sober and safe setting for him to live . Not currently interested in rehab referral. Denies suicidal ideations. Denies medication side effects.    Principal Problem: Severe recurrent major depression without psychotic features ([place]) Diagnosis:   Patient Active Problem List   Diagnosis Date Noted  . Substance induced mood disorder ([place]) [F19.94] 10/15/2017  . Severe recurrent major depression without psychotic features ([place]) [F33.2] 10/14/2017   Total Time spent with patient: 20 minutes  Past Psychiatric History: See H&P  Past Medical History:  Past Medical History:  Diagnosis Date  . ADHD (attention deficit hyperactivity disorder)   . Bipolar disorder ([place])   . Outbursts of anger    History reviewed. No pertinent surgical history. Family History: History reviewed. No pertinent family history. Family Psychiatric  History: See H&P Social History:  Social History   Substance and Sexual Activity  Alcohol Use Yes    Comment: occ     Social History   Substance and Sexual Activity  Drug Use Yes  . Types: Marijuana, Cocaine, Methamphetamines   Comment: "alot", pills, Molly    Social History   Socioeconomic History  . Marital status: Single    Spouse name: None  . Number of children: None  . Years of education: None  . Highest education level: None  Social Needs  . Financial resource strain: None  . Food insecurity - worry: None  . Food insecurity - inability: None  . Transportation needs - medical: None  . Transportation needs - non-medical: None  Occupational History  . None  Tobacco Use  . Smoking status: Current Every Day Smoker    Packs/day: 0.50    Types: Cigarettes  . Smokeless tobacco: Never Used  Substance and Sexual Activity  . Alcohol use: Yes    Comment: occ  . Drug use: Yes    Types: Marijuana, Cocaine, Methamphetamines    Comment: "alot", pills, Molly  . Sexual activity: No  Other Topics Concern  . None  Social History Narrative  . None   Additional Social History:    Pain Medications: denies Prescriptions: denies Over the Counter: denies History of alcohol / drug use?: Yes Longest period of sobriety (when/how long): unknown Negative Consequences of Use: Personal relationships Withdrawal Symptoms: Other (Comment)(denies) Name of Substance 1: marijuana 1 - Age of First Use: 21 1 - Amount (size/oz): varies 1 - Frequency: daily 1 - Duration: on-going 1 - Last Use / Amount: today Name of Substance  2: meth 2 - Age of First Use: 26 2 - Amount (size/oz): varies 2 - Frequency: rarely 2 - Duration: within the past year 2 - Last Use / Amount: "last night" "snorted a line"  Name of Substance 3: cocaine 3 - Age of First Use: 23 3 - Amount (size/oz): 1 gram 3 - Frequency: rarely 3 - Duration: on-going 3 - Last Use / Amount: "a week ago"   Sleep: improving  Appetite:  Good  Current Medications: Current Facility-Administered Medications  Medication Dose  Route Frequency Provider Last Rate Last Dose  . acetaminophen (TYLENOL) tablet 650 mg  650 mg Oral Q6H PRN Lindon Romp A, NP      . alum & mag hydroxide-simeth (MAALOX/MYLANTA) 200-200-20 MG/5ML suspension 30 mL  30 mL Oral Q4H PRN Lindon Romp A, NP      . FLUoxetine (PROZAC) capsule 10 mg  10 mg Oral Daily Derrill Center, NP   10 mg at 10/17/17 0819  . gabapentin (NEURONTIN) capsule 200 mg  200 mg Oral BID Derrill Center, NP   200 mg at 10/17/17 0820  . hydrOXYzine (ATARAX/VISTARIL) tablet 25 mg  25 mg Oral TID PRN Rozetta Nunnery, NP   25 mg at 10/16/17 1943  . loperamide (IMODIUM) capsule 2-4 mg  2-4 mg Oral PRN Lindon Romp A, NP      . magnesium hydroxide (MILK OF MAGNESIA) suspension 30 mL  30 mL Oral Daily PRN Lindon Romp A, NP      . nicotine polacrilex (NICORETTE) gum 2 mg  2 mg Oral PRN Izediuno, Laruth Bouchard, MD   2 mg at 10/17/17 1316  . ondansetron (ZOFRAN-ODT) disintegrating tablet 4 mg  4 mg Oral Q6H PRN Lindon Romp A, NP      . traZODone (DESYREL) tablet 50 mg  50 mg Oral QHS PRN Rozetta Nunnery, NP   50 mg at 10/16/17 2113    Lab Results:  No results found for this or any previous visit (from the past 48 hour(s)).  Blood Alcohol level:  Lab Results  Component Value Date   [facility] <11 05/14/2013   ETH <11 02/33/4356    Metabolic Disorder Labs: Lab Results  Component Value Date   HGBA1C 4.9 10/15/2017   MPG 93.93 10/15/2017   No results found for: PROLACTIN Lab Results  Component Value Date   CHOL 168 10/15/2017   TRIG 117 10/15/2017   HDL 63 10/15/2017   CHOLHDL 2.7 10/15/2017   VLDL 23 10/15/2017   LDLCALC 82 10/15/2017    Physical Findings: AIMS: Facial and Oral Movements Muscles of Facial Expression: None, normal Lips and Perioral Area: None, normal Jaw: None, normal Tongue: None, normal,Extremity Movements Upper (arms, wrists, hands, fingers): None, normal Lower (legs, knees, ankles, toes): None, normal, Trunk Movements Neck, shoulders, hips: None,  normal, Overall Severity Severity of abnormal movements (highest score from questions above): None, normal Incapacitation due to abnormal movements: None, normal Patient's awareness of abnormal movements (rate only patient's report): No Awareness, Dental Status Current problems with teeth and/or dentures?: No Does patient usually wear dentures?: No  CIWA:  CIWA-Ar Total: 0 COWS:  COWS Total Score: 3  Musculoskeletal: Strength & Muscle Tone: within normal limits Gait & Station: normal Patient leans: N/A  Psychiatric Specialty Exam: Physical Exam  Nursing note and vitals reviewed. Constitutional: He is oriented to person, place, and time. He appears well-developed and well-nourished.  Cardiovascular: Normal rate.  Respiratory: Effort normal.  Musculoskeletal: Normal range of motion.  Neurological: He is alert and oriented to person, place, and time.  Skin: Skin is warm.    Review of Systems  Constitutional: Negative.   HENT: Negative.   Eyes: Negative.   Respiratory: Negative.   Cardiovascular: Negative.   Gastrointestinal: Negative.   Genitourinary: Negative.   Musculoskeletal: Negative.   Skin: Negative.   Neurological: Negative.   Endo/Heme/Allergies: Negative.   Psychiatric/Behavioral: Negative.   denies headache, denies chest pain, denies shortness of breath  Blood pressure 108/73, pulse 78, temperature 97.6 F (36.4 C), temperature source Oral, resp. rate 16, height _0  (1.727 m), weight 64.9 kg (143 lb).Body mass index is 21.74 kg/m.  General Appearance: improved grooming   Eye Contact:  Good  Speech:  Normal Rate  Volume:  Normal  Mood:  reports improved mood and currently presents euthymic   Affect:  Appropriate and reactive  Thought Process:  Linear and Descriptions of Associations: Intact  Orientation:  Other:  fully alert and attentive   Thought Content:  denies hallucinations, no delusions, not internally preoccupied   Suicidal Thoughts:  No denies  suicidal ideations, denies homicidal ideations, contracts for safety on unit   Homicidal Thoughts:  No  Memory: recent and remote grossly intact   Judgement:  Fair- improving   Insight:  Fair- improving   Psychomotor Activity:  Normal  Concentration:  Concentration: Good and Attention Span: Good  Recall:  Good  Fund of Knowledge:  Good  Language:  Good  Akathisia:  No  Handed:  Right  AIMS (if indicated):     Assets:  Communication Skills Desire for Improvement Financial Resources/Insurance Housing Physical Health Social Support Transportation  ADL's:  Intact  Cognition:  WNL  Sleep:  Number of Hours: 6   Assessment - patient is reporting improved mood and currently presents euthymic. Denies SI. Tolerating medications well and denies medication side effects at this time. As he improves he is focusing on discharge planning, reports he plans to go live with a friend .  Treatment Plan Summary: I have reviewed disposition plan as below today 1/8  Daily contact with patient to assess and evaluate symptoms and progress in treatment, Medication management and Plan is to:  -Encourage ongoing efforts to work on sobriety and relapse prevention -Encourage group and milieu participation to work on Radiographer, therapeutic and symptom reduction -Continue Prozac 10 mg QDAY for depression, anxiety  -Continue Gabapentin 200 mg  BID for anxiety  -Continue Vistaril 25 mg  TID PRN for anxiety -Continue Trazodone 50 mgQHS PRN for insomnia -Treatment team working on disposition planning options  Jenne Campus, MD 10/17/2017, 4:29 PM   Patient ID: Abner Greenspan, male   DOB: 12/31/1990, 27 y.o.   MRN: [id number]

## 2017-10-17 NOTE — Plan of Care (Signed)
  Progressing Activity: Sleeping patterns will improve 10/17/2017 2136 - Progressing by Arrie Aranhurch, Athen Riel J, RN Note Slept 6 hours last night according to flowsheet.

## 2017-10-17 NOTE — Progress Notes (Signed)
Recreation Therapy Notes  Animal-Assisted Activity (AAA) Program Checklist/Progress Notes Patient Eligibility Criteria Checklist & Daily Group note for Rec TxIntervention  Date: 01.18.2018 Time: 2:45pm [address]   AAA/T Program Assumption of Risk Form signed by Patient/ or Parent Legal Guardian Yes  Patient is free of allergies or sever asthma  Yes  Patient reports no fear of animals Yes  Patient reports no history of cruelty to animals Yes   Patient understands his/her participation is voluntary Yes  Patient washes hands before animal contact Yes  Patient washes hands after animal contact Yes  Behavioral Response: Appropriate, Attentive   Education:Hand Washing, Appropriate Animal Interaction   Education Outcome: Acknowledges education.   Clinical Observations/Feedback: Patient attended session and interacted appropriately with therapy dog and peers.   Marykay Lexenise L Iram Lundberg, LRT/CTRS        Ramina Hulet L 10/17/2017 2:55 PM

## 2017-10-17 NOTE — Progress Notes (Signed)
D:Pt is anxious and fidgety on the unit. Pt's mood was labile this morning. When writer went to his room to request him to come to the medication window for his medicines, he was reluctant to get out of bed and cursed as he got up. He then came to the medication window Building services engineerthanking writer and taking his medication without difficulty. Pt requested to have his jacket brought to the unit and washed that was in his locker. He requested that the strings be cut and to wear his jacket on the unit.  A:Pt was given paper scrubs and jacket was brought to the unit. Pt clothes are being washed. Offered support and 15 minute checks.  R:Pt denies si and hi. Safety maintained on the unit.

## 2017-10-17 NOTE — BHH Group Notes (Signed)
LCSW Group Therapy Note  10/17/2017 1:15pm  Type of Therapy/Topic:  Group Therapy:  Feelings about Diagnosis  Participation Level:  Did Not Attend-pt invited. Agitated in room due to not discharging today/ declined group invitation.    Description of Group:   This group will allow patients to explore their thoughts and feelings about diagnoses they have received. Patients will be guided to explore their level of understanding and acceptance of these diagnoses. Facilitator will encourage patients to process their thoughts and feelings about the reactions of others to their diagnosis and will guide patients in identifying ways to discuss their diagnosis with significant others in their lives. This group will be process-oriented, with patients participating in exploration of their own experiences, giving and receiving support, and processing challenge from other group members.   Therapeutic Goals: 1. Patient will demonstrate understanding of diagnosis as evidenced by identifying two or more symptoms of the disorder 2. Patient will be able to express two feelings regarding the diagnosis 3. Patient will demonstrate their ability to communicate their needs through discussion and/or role play  Summary of Patient Progress:  x   Therapeutic Modalities:   Cognitive Behavioral Therapy Brief Therapy Feelings Identification    Ledell PeoplesHeather N Smart, LCSW 10/17/2017 3:18 PM

## 2017-10-18 MED ORDER — HYDROXYZINE HCL 25 MG PO TABS: 25.0000 mg | ORAL_TABLET | Freq: Three times a day (TID) | ORAL | 0 refills | Status: DC | PRN

## 2017-10-18 MED ORDER — FLUOXETINE HCL 10 MG PO CAPS: 10.0000 mg | ORAL_CAPSULE | Freq: Every day | ORAL | 0 refills | Status: DC

## 2017-10-18 MED ORDER — GABAPENTIN 100 MG PO CAPS: 200.0000 mg | ORAL_CAPSULE | Freq: Two times a day (BID) | ORAL | 0 refills | Status: DC

## 2017-10-18 MED ORDER — TRAZODONE HCL 50 MG PO TABS: 50.0000 mg | ORAL_TABLET | Freq: Every evening | ORAL | 0 refills | Status: DC | PRN

## 2017-10-18 NOTE — Progress Notes (Signed)
Patient ID: Caleb SchickCameron Tutton, male   DOB: 13-Jul-1991, 27 y.o.   MRN: [id number]  Discharge Note- Belongings returned to patient at time of discharge. Discharge instructions and medications were reviewed with patient. Patient verbalized understanding of both medications and discharge instructions. Patient discharged to lobby with bus pass. Q15 minute safety checks maintained until time of discharge. No distress upon discharge.

## 2017-10-18 NOTE — Progress Notes (Signed)
  [facility] :  Will you be returning to the same living situation after discharge:  Yes,  home At discharge, do you have transportation home?: Yes,  family member Do you have the ability to pay for your medications: Yes,  mental health  Release of information consent forms completed and submitted to medical records by CSW.  Patient to Follow up at: Follow-up Information    [facility] Follow up on 10/23/2017.   Why:  Hospital follow-up on Monday 1/14 at 8:15AM. Please bring: photo ID, social security card, hospital follow-up paperwork with you to this appt. Thank you.  Contact information: [address] [place] [id number] [phone]           Next level of care provider [facility] Link:no  Safety Planning and Suicide Prevention discussed: Yes,  SPE completed with pt; pt declined to consent to family contact. SPI pamphlet and Mobile Crisis information provided to pt.   Have you used any form of tobacco in the last 30 days? (Cigarettes, Smokeless Tobacco, Cigars, and/or Pipes): Yes  Has patient been referred to the Quitline?: Patient refused referral  Patient has been referred for addiction treatment: Yes  Pulte HomesHeather N Smart, LCSW 10/18/2017, 8:50 AM

## 2017-10-18 NOTE — Progress Notes (Signed)
Recreation Therapy Notes  Date: 10/18/17 Time: 0930 Location: [address] Day[address]  Group Topic: Stress Management  Goal Area(s) Addresses:  Patient will verbalize importance of using healthy stress management.  Patient will identify positive emotions associated with healthy stress management.   Behavioral Response: Engaged  Intervention: Stress Management  Activity : Guided Imagery.  LRT introduced the stress management technique of guided imagery.  LRT read a script that allowed patients to visualize themselves along the beach.  Patients were to listen and follow along as script was read to engage in the activity.  Education: Stress Management, Discharge Planning.   Education Outcome: Acknowledges edcuation/In group clarification offered/Needs additional education  Clinical Observations/Feedback: Pt attended group.   Caroll RancherMarjette Shaindy Reader, LRT/CTRS          Caroll RancherLindsay, Garreth Burnsworth A 10/18/2017 11:51 AM

## 2017-10-18 NOTE — [facility] Suicide Risk Assessment (Signed)
[facility]   Principal Problem: Severe recurrent major depression without psychotic features [facility](HCC) Discharge Diagnoses:  Patient Active Problem List   Diagnosis Date Noted  . Substance induced mood disorder ([facility]) [F19.94] 10/15/2017  . Severe recurrent major depression without psychotic features ([facility]) [F33.2] 10/14/2017    Total Time spent with patient: 30 minutes  Musculoskeletal: Strength & Muscle Tone: within normal limits Gait & Station: normal Patient leans: N/A  Psychiatric Specialty Exam: ROS denies headache, no chest pain, no shortness of breath, no vomiting   Blood pressure 129/80, pulse 75, temperature 98 F (36.7 C), temperature source Oral, resp. rate 16, height 5\' 8"  (1.727 m), weight 64.9 kg (143 lb).Body mass index is 21.74 kg/m.  General Appearance: improved grooming   Eye Contact::  Good  Speech:  Normal Rate409  Volume:  Normal  Mood:  presents euthymic , denies depression  Affect:  Appropriate and Full Range  Thought Process:  Linear and Descriptions of Associations: Intact  Orientation:  Other:  fully alert and attentive   Thought Content:  no hallucinations, no delusions,not internally preoccupied   Suicidal Thoughts:  No denies any suicidal ideations, denies any homicidal or violent ideations  Homicidal Thoughts:  No  Memory:  recent and remote grossly intact   Judgement:  Other:  improving   Insight:  improving   Psychomotor Activity:  Normal  Concentration:  Good  Recall:  Good  Fund of Knowledge:Good  Language: Good  Akathisia:  Negative  Handed:  Right  AIMS (if indicated):     Assets:  Communication Skills Desire for Improvement Resilience  Sleep:  Number of Hours: 6.75  Cognition: WNL  ADL's:  Intact   Mental Status Per Nursing Assessment::   On Admission:  NA  Demographic Factors:  27 year old single male, had been living with GF, but states he plans to go live with a friend after discharge. Currently  unemployed .  Loss Factors: Substance abuse, recent break up   Historical Factors: Prior psychiatric admission for depression, history of self cutting . History of substance abuse, identifies cocaine as substance of choice .  Risk Reduction Factors:   Responsible for children under 28 years of age, Living with another person, especially a relative and Positive coping skills or problem solving skills  Continued Clinical Symptoms:  Patient presents alert, attentive, well related, currently pleasant, euthymic, denies feeling depressed, affect is bright and reactive, no thought disorder, no suicidal or self injurious ideations, specifically also denies any homicidal ideations towards his ex GF , no hallucinations, no delusions, not internally preoccupied . Presents future oriented, states he is planning on going to live with a friend who provides a sober environment, and is hoping to get a job soon. Behavior on unit in good control, pleasant on approach    Cognitive Features That Contribute To Risk:  No gross cognitive deficits noted upon discharge. Is alert , attentive, and oriented x 3   Suicide Risk:  Mild:  Suicidal ideation of limited frequency, intensity, duration, and specificity.  There are no identifiable plans, no associated intent, mild dysphoria and related symptoms, good self-control (both objective and subjective assessment), few other risk factors, and identifiable protective factors, including available and accessible social support.  Follow-up Information    [facility] Follow up on 10/23/2017.   Why:  Hospital follow-up on Monday 1/14 at 8:15AM. Please bring: photo ID, social security card, hospital follow-up paperwork with you to this appt. Thank you.  Contact information: [address] [place] [address] [phone]           Plan Of Care/Follow-up recommendations:  Activity:  as tolerated  Diet:  regular Tests:  NA Other:  See below  Patient is expressing  readiness for discharge and there are no current grounds for involuntary commitment  Patient is leaving in good spirits  Encouraged to avoid people, places and situations he associates with drug use in order to decrease cravings and triggers for relapse . Follow up as above   Craige Cotta, MD 10/18/2017, 10:25 AM

## 2017-10-18 NOTE — Progress Notes (Signed)
Patient ID: Caleb SchickCameron Ellis, male   DOB: July 26, 1991, 27 y.o.   MRN: [id number]  DAR: Pt. Denies SI/HI and A/V Hallucinations. He reports sleep last night was good, his appetite is good, his energy level is normal, and his concentration is good. He rates his depression, hopelessness, and anxiety levels 0/10. He denies withdrawal symptoms at this time. Patient does not report any pain or discomfort at this time. Support and encouragement provided to the patient. Scheduled and PRN medications administered to patient per physician's orders. Patient is receptive and cooperative. He is seen in the milieu interacting with peers and is jovial at this time. He reports that he is excited about discharge. Q15 minute checks are maintained for safety.

## 2017-10-18 NOTE — Discharge Summary (Signed)
Physician Discharge Summary Note  Patient:  Caleb Ellis is an 27 y.o., male MRN:  [id number] DOB:  [birth year] Patient phone:  [phone] (home)  Patient address:   [address] [place] [id number],  Total Time spent with patient: 20 minutes  Date of Admission:  10/14/2017 Date of Discharge: 10/18/17   Reason for Admission:  Worsening depression with SI  Principal Problem: Severe recurrent major depression without psychotic features [facility](HCC) Discharge Diagnoses: Patient Active Problem List   Diagnosis Date Noted  . Substance induced mood disorder (HCC) [F19.94] 10/15/2017  . Severe recurrent major depression without psychotic features (HCC) [F33.2] 10/14/2017    Past Psychiatric History: Intermittent explosive disorder, MDD, ADHD  Past Medical History:  Past Medical History:  Diagnosis Date  . ADHD (attention deficit hyperactivity disorder)   . Bipolar disorder (HCC)   . Outbursts of anger    History reviewed. No pertinent surgical history. Family History: History reviewed. No pertinent family history. Family Psychiatric  History: Unknown Social History:  Social History   Substance and Sexual Activity  Alcohol Use Yes   Comment: occ     Social History   Substance and Sexual Activity  Drug Use Yes  . Types: Marijuana, Cocaine, Methamphetamines   Comment: "alot", pills, Molly    Social History   Socioeconomic History  . Marital status: Single    Spouse name: None  . Number of children: None  . Years of education: None  . Highest education level: None  Social Needs  . Financial resource strain: None  . Food insecurity - worry: None  . Food insecurity - inability: None  . Transportation needs - medical: None  . Transportation needs - non-medical: None  Occupational History  . None  Tobacco Use  . Smoking status: Current Every Day Smoker    Packs/day: 0.50    Types: Cigarettes  . Smokeless tobacco: Never Used  Substance and Sexual Activity   . Alcohol use: Yes    Comment: occ  . Drug use: Yes    Types: Marijuana, Cocaine, Methamphetamines    Comment: "alot", pills, Molly  . Sexual activity: No  Other Topics Concern  . None  Social History Narrative  . None    Hospital Course:   [facility] Assessment: Per assessment  note- Foye ClockCameron Crepesis an 27 y.o.malethat presents this datevoluntarywith thoughts of self harm with a plan to jump off a bridge or parking deck. Patient is observed to be very tearful as he interacts with this Clinical research associatewriter. Patient reports that he had a verbal altercation earlier this date with his partner of three years over child care. Patient states they have a 1018 month old child together and partner is threatening to not allow patient to see the child due to recent trespassing charges. Patient states he currentlyison unsupervised probation for multiple charges to include simple assaultandtrespassing. Patient denies any H/I or AVH and is time/place oriented. Patient reports one prior attempt at self harm in 2016 when he "cut his wrist" and [facility] but was not admitted inpatient at that time. Patient states he "was not sure if he was trying to kill himself" at that time. Patient stated he "just wanted to show his girlfriend what he is willing to do." Patient stated he was remorseful in reference to that incident and was diagnosed with depression at that time by that provider.Patient stated that he "might have been on medications" but cannot recall what those medications were. Patient denies  any other OP care or inpatient admissions. Per record review patient was seen at [facility] in 2013 for some S/I but was not admitted. Patient reports ongoing depression with symptoms to include: isolating, guilt and feelings of worthlessness. Patient reports ongoing SA use of Cannabis stating he uses up to 1 gram a day for the last year with last use earlier this date when patient reported using 1 gram. Patient denies any  other SA use. Per notes, patientpresents to the Emergency Department brought in by [employer] after his friend reports he was experiencing SI. Patient has a history ofbipolar disorderper note reviewand anger outbursts. Case was staffed with [facility] FNP who recommended a inpatient admission as appropriate bed placement is investigated. ON Evaluation: Caleb Ellis is awake, alert and oriented. Patient seen standing at the nursing stations. validates information that was provided in the above assessment. Continues to report passive suicidal ideation if his girlfriend will not take him back. Reports " I need to due better for my kids and ex girlfriend. Patient is able to contract for safety. Patient reports I need to be restarted on my medications however is unable to recall the names of medications.   Denies auditory or visual hallucination and does not appear to be responding to internal stimuli Support, encouragement and reassurance was provided.  Patient remained on the [facility] unit for 3 days and stabilized with medications and therapy. Patient was started on Prozac 10 mg Daily, Gabapentin 200 mg BID and used Trazodone and Vistaril while on the unit. Patient showed improvement with improved mood, affect, appetite, sleep, and interaction. Patient was seen in the day room interacting with peers and staff appropriately. He was also seen attending groups and participating. Patinet was offered rehab residential facility and refused it several times. He agrees to follow up at [facility] instead. Patient denies any SI/HI/AVH and contracts for safety. He is provided with prescriptions and samples for his medications upon discharge.    Physical Findings: AIMS: Facial and Oral Movements Muscles of Facial Expression: None, normal Lips and Perioral Area: None, normal Jaw: None, normal Tongue: None, normal,Extremity Movements Upper (arms, wrists, hands, fingers): None, normal Lower (legs, knees, ankles, toes): None,  normal, Trunk Movements Neck, shoulders, hips: None, normal, Overall Severity Severity of abnormal movements (highest score from questions above): None, normal Incapacitation due to abnormal movements: None, normal Patient's awareness of abnormal movements (rate only patient's report): No Awareness, Dental Status Current problems with teeth and/or dentures?: No Does patient usually wear dentures?: No  CIWA:  CIWA-Ar Total: 0 COWS:  COWS Total Score: 3  Musculoskeletal: Strength & Muscle Tone: within normal limits Gait & Station: normal Patient leans: N/A  Psychiatric Specialty Exam: Physical Exam  Nursing note and vitals reviewed. Constitutional: He is oriented to person, place, and time. He appears well-developed and well-nourished.  Cardiovascular: Normal rate.  Respiratory: Effort normal.  Musculoskeletal: Normal range of motion.  Neurological: He is alert and oriented to person, place, and time.  Skin: Skin is warm.    Review of Systems  Constitutional: Negative.   HENT: Negative.   Eyes: Negative.   Respiratory: Negative.   Cardiovascular: Negative.   Gastrointestinal: Negative.   Genitourinary: Negative.   Musculoskeletal: Negative.   Skin: Negative.   Neurological: Negative.   Endo/Heme/Allergies: Negative.   Psychiatric/Behavioral: Negative.     Blood pressure 129/80, pulse 75, temperature 98 F (36.7 C), temperature source Oral, resp. rate 16, height 5\' 8"  (1.727 m), weight 64.9 kg (143 lb).Body mass index is  21.74 kg/m.  General Appearance: Casual  Eye Contact:  Good  Speech:  Clear and Coherent and Normal Rate  Volume:  Normal  Mood:  Euthymic  Affect:  Appropriate  Thought Process:  Goal Directed and Descriptions of Associations: Intact  Orientation:  Full (Time, Place, and Person)  Thought Content:  WDL  Suicidal Thoughts:  No  Homicidal Thoughts:  No  Memory:  Immediate;   Good Recent;   Good Remote;   Good  Judgement:  Fair  Insight:  Good   Psychomotor Activity:  Normal  Concentration:  Concentration: Good and Attention Span: Good  Recall:  Good  Fund of Knowledge:  Good  Language:  Good  Akathisia:  No  Handed:  Right  AIMS (if indicated):     Assets:  Communication Skills Desire for Improvement Financial Resources/Insurance Housing Physical Health Social Support Transportation  ADL's:  Intact  Cognition:  WNL  Sleep:  Number of Hours: 6.75     Have you used any form of tobacco in the last 30 days? (Cigarettes, Smokeless Tobacco, Cigars, and/or Pipes): Yes  Has this patient used any form of tobacco in the last 30 days? (Cigarettes, Smokeless Tobacco, Cigars, and/or Pipes) Yes, Yes, A prescription for an FDA-approved tobacco cessation medication was offered at discharge and the patient refused  Blood Alcohol level:  Lab Results  Component Value Date   [facility] <11 05/14/2013   ETH <11 04/13/2013    Metabolic Disorder Labs:  Lab Results  Component Value Date   HGBA1C 4.9 10/15/2017   MPG 93.93 10/15/2017   No results found for: PROLACTIN Lab Results  Component Value Date   CHOL 168 10/15/2017   TRIG 117 10/15/2017   HDL 63 10/15/2017   CHOLHDL 2.7 10/15/2017   VLDL 23 10/15/2017   LDLCALC 82 10/15/2017    See Psychiatric Specialty Exam and Suicide Risk Assessment completed by Attending Physician prior to discharge.  Discharge destination:  Home  Is patient on multiple antipsychotic therapies at discharge:  No   Has Patient had three or more failed trials of antipsychotic monotherapy by history:  No  Recommended Plan for Multiple Antipsychotic Therapies: NA   Allergies as of 10/18/2017   No Known Allergies     Medication List    TAKE these medications     Indication  FLUoxetine 10 MG capsule Commonly known as:  PROZAC Take 1 capsule (10 mg total) by mouth daily. For mood control  Indication:  mood stability   gabapentin 100 MG capsule Commonly known as:  NEURONTIN Take 2 capsules (200  mg total) by mouth 2 (two) times daily.  Indication:  Agitation   hydrOXYzine 25 MG tablet Commonly known as:  ATARAX/VISTARIL Take 1 tablet (25 mg total) by mouth 3 (three) times daily as needed for anxiety.  Indication:  Feeling Anxious   traZODone 50 MG tablet Commonly known as:  DESYREL Take 1 tablet (50 mg total) by mouth at bedtime as needed for sleep.  Indication:  Trouble Sleeping      Follow-up Information    [facility] Follow up on 10/23/2017.   Why:  Hospital follow-up on Monday 1/14 at 8:15AM. Please bring: photo ID, social security card, hospital follow-up paperwork with you to this appt. Thank you.  Contact information: [address] [place] [address] [phone]           Follow-up recommendations:  Continue activity as tolerated. Continue diet as recommended by your PCP. Ensure to keep all appointments with  outpatient providers.  Comments:  Patient is instructed prior to discharge to: Take all medications as prescribed by his/her mental healthcare provider. Report any adverse effects and or reactions from the medicines to his/her outpatient provider promptly. Patient has been instructed & cautioned: To not engage in alcohol and or illegal drug use while on prescription medicines. In the event of worsening symptoms, patient is instructed to call the crisis hotline, 911 and or go to the nearest ED for appropriate evaluation and treatment of symptoms. To follow-up with his/her primary care provider for your other medical issues, concerns and or health care needs.    Signed: Gerlene Burdock Creighton Longley, FNP 10/18/2017, 8:05 AM

## 2017-11-13 ENCOUNTER — Encounter (HOSPITAL_COMMUNITY): Payer: Self-pay | Admitting: Emergency Medicine

## 2017-11-13 ENCOUNTER — Other Ambulatory Visit: Payer: Self-pay

## 2017-11-13 ENCOUNTER — Encounter (HOSPITAL_BASED_OUTPATIENT_CLINIC_OR_DEPARTMENT_OTHER): Payer: Self-pay | Admitting: *Deleted

## 2017-11-13 ENCOUNTER — Emergency Department (HOSPITAL_BASED_OUTPATIENT_CLINIC_OR_DEPARTMENT_OTHER)
Admission: EM | Admit: 2017-11-13 | Discharge: 2017-11-13 | Discharge status: Against Medical Advice | Payer: Self-pay | Attending: Emergency Medicine | Admitting: Emergency Medicine

## 2017-11-13 DIAGNOSIS — K0889 Other specified disorders of teeth and supporting structures: Secondary | ICD-10-CM | POA: Insufficient documentation

## 2017-11-13 DIAGNOSIS — Z5321 Procedure and treatment not carried out due to patient leaving prior to being seen by health care provider: Secondary | ICD-10-CM | POA: Insufficient documentation

## 2017-11-13 NOTE — ED Triage Notes (Signed)
Patient c/o lower bilateral dental pain x 1 week. Patient concerned about dental abscess. Patient has poor dental hygiene. Reports taking tylenol earlier with no relief.

## 2017-11-13 NOTE — ED Triage Notes (Signed)
Pt c/o dental pain x 1 week 

## 2017-11-13 NOTE — ED Notes (Signed)
[facility].

## 2017-11-14 ENCOUNTER — Emergency Department (HOSPITAL_COMMUNITY)
Admission: EM | Admit: 2017-11-14 | Discharge: 2017-11-14 | Disposition: A | Discharge status: Home / Self Care | Payer: Self-pay | Attending: Emergency Medicine | Admitting: Emergency Medicine

## 2017-11-14 NOTE — ED Notes (Signed)
No answer when called for recheck on vitals signs

## 2017-11-14 NOTE — ED Notes (Signed)
I called patient name in the lobby to go back to a room and no one responded

## 2017-12-13 ENCOUNTER — Emergency Department
Admission: EM | Admit: 2017-12-13 | Discharge: 2017-12-13 | Disposition: A | Discharge status: Home / Self Care | Payer: Self-pay | Attending: Emergency Medicine | Admitting: Emergency Medicine

## 2017-12-13 ENCOUNTER — Emergency Department (HOSPITAL_BASED_OUTPATIENT_CLINIC_OR_DEPARTMENT_OTHER)
Admission: EM | Admit: 2017-12-13 | Discharge: 2017-12-13 | Disposition: A | Discharge status: Home / Self Care | Payer: Self-pay | Attending: Emergency Medicine | Admitting: Emergency Medicine

## 2017-12-13 ENCOUNTER — Encounter (HOSPITAL_BASED_OUTPATIENT_CLINIC_OR_DEPARTMENT_OTHER): Payer: Self-pay

## 2017-12-13 ENCOUNTER — Encounter: Payer: Self-pay | Admitting: Emergency Medicine

## 2017-12-13 ENCOUNTER — Other Ambulatory Visit: Payer: Self-pay

## 2017-12-13 DIAGNOSIS — K0889 Other specified disorders of teeth and supporting structures: Secondary | ICD-10-CM | POA: Insufficient documentation

## 2017-12-13 DIAGNOSIS — K047 Periapical abscess without sinus: Secondary | ICD-10-CM | POA: Insufficient documentation

## 2017-12-13 DIAGNOSIS — Z79899 Other long term (current) drug therapy: Secondary | ICD-10-CM | POA: Insufficient documentation

## 2017-12-13 DIAGNOSIS — F909 Attention-deficit hyperactivity disorder, unspecified type: Secondary | ICD-10-CM | POA: Insufficient documentation

## 2017-12-13 DIAGNOSIS — F17210 Nicotine dependence, cigarettes, uncomplicated: Secondary | ICD-10-CM | POA: Insufficient documentation

## 2017-12-13 DIAGNOSIS — Z5321 Procedure and treatment not carried out due to patient leaving prior to being seen by health care provider: Secondary | ICD-10-CM | POA: Insufficient documentation

## 2017-12-13 HISTORY — DX: Other psychoactive substance abuse, uncomplicated: F19.10

## 2017-12-13 MED ORDER — HYDROCODONE-ACETAMINOPHEN 5-325 MG PO TABS: 1.0000 | ORAL_TABLET | ORAL | 0 refills | Status: AC | PRN

## 2017-12-13 MED ORDER — AMOXICILLIN 500 MG PO TABS: 500.0000 mg | ORAL_TABLET | Freq: Three times a day (TID) | ORAL | 0 refills | Status: DC

## 2017-12-13 NOTE — ED Notes (Signed)
Pt resting with no complaints

## 2017-12-13 NOTE — ED Notes (Signed)
Pt called to treatment room with no answer from lobby. Pt not visualized in lobby or bistro area.

## 2017-12-13 NOTE — ED Triage Notes (Signed)
Pt with dental abscess for two days.

## 2017-12-13 NOTE — ED Notes (Signed)
Pt was called for tx room assignment-was not in ED WR-was back in ED WR and is now not in ED WR again

## 2017-12-13 NOTE — ED Triage Notes (Addendum)
C/o left lower toothache x 3 days-swelling x 2 days-[facility]-dx with abscess-meds given-pt states is worse-marked swelling noted to left lower jaw area-NAD-steady gait

## 2017-12-13 NOTE — ED Provider Notes (Signed)
[facility] Emergency Department Provider Note ____________________________________________  Time seen: Approximately 9:29 AM  I have reviewed the triage vital signs and the nursing notes.   HISTORY  Chief Complaint Dental Pain   HPI Daneen SchickCameron Carcione is a 27 y.o. male who presents to the emergency department for treatment and evaluation of dental pain. Pain has been present off and on for the past couple of weeks and has gotten worse over the past 24 hours.  No relief with Tylenol or ibuprofen  Past Medical History:  Diagnosis Date  . ADHD (attention deficit hyperactivity disorder)   . Bipolar disorder (HCC)   . Outbursts of anger     Patient Active Problem List   Diagnosis Date Noted  . Substance induced mood disorder (HCC) 10/15/2017  . Severe recurrent major depression without psychotic features (HCC) 10/14/2017    History reviewed. No pertinent surgical history.  Prior to Admission medications   Medication Sig Start Date End Date Taking? Authorizing Provider  FLUoxetine (PROZAC) 10 MG capsule Take 1 capsule (10 mg total) by mouth daily. For mood control 10/18/17   Money, Gerlene Burdockravis B, FNP  gabapentin (NEURONTIN) 100 MG capsule Take 2 capsules (200 mg total) by mouth 2 (two) times daily. 10/18/17   Money, Gerlene Burdockravis B, FNP  hydrOXYzine (ATARAX/VISTARIL) 25 MG tablet Take 1 tablet (25 mg total) by mouth 3 (three) times daily as needed for anxiety. 10/18/17   Money, Gerlene Burdockravis B, FNP  traZODone (DESYREL) 50 MG tablet Take 1 tablet (50 mg total) by mouth at bedtime as needed for sleep. 10/18/17   Money, Gerlene Burdockravis B, FNP    Allergies Patient has no known allergies.  No family history on file.  Social History Social History   Tobacco Use  . Smoking status: Current Every Day Smoker    Packs/day: 0.50    Types: Cigarettes  . Smokeless tobacco: Never Used  Substance Use Topics  . Alcohol use: Yes    Comment: occ  . Drug use: Yes    Types: Marijuana, Cocaine,  Methamphetamines    Comment: "alot", pills, Molly    Review of Systems Constitutional: Negative for fever ENT: Positive for dental pain Musculoskeletal: Negative for trismus Skin: Positive for left-sided facial swelling ____________________________________________   PHYSICAL EXAM:  VITAL SIGNS: ED Triage Vitals  Enc Vitals Group     BP 12/13/17 0802 131/79     Pulse Rate 12/13/17 0801 60     Resp 12/13/17 0801 20     Temp 12/13/17 0801 98 F (36.7 C)     Temp Source 12/13/17 0801 Oral     SpO2 12/13/17 0801 99 %     Weight --      Height --      Head Circumference --      Peak Flow --      Pain Score 12/13/17 0800 10     Pain Loc --      Pain Edu? --      Excl. in GC? --     Constitutional: Alert and oriented. Well appearing and in no acute distress. Eyes: Conjunctiva are clear Mouth/Throat: See periodontal exam Periodontal Exam    Hematological/Lymphatic/Immunilogical: No palpable cervical nodes. Respiratory: Respirations even and unlabored. Musculoskeletal: Full range of motion of the jaw is observed Neurologic: Awake, alert, oriented x4. Skin: Skin overlying the left mandible is swollen but without erythema Psychiatric: Affect and behavior are appropriate.  ____________________________________________   LABS (all labs ordered are listed, but only abnormal results are  displayed)  Labs Reviewed - No data to display ____________________________________________   RADIOLOGY  Not indicated ____________________________________________   PROCEDURES  Procedure(s) performed: None  Critical Care performed: No ____________________________________________   INITIAL IMPRESSION / ASSESSMENT AND PLAN / ED COURSE  Bhavesh Vazquez is a 27 y.o. male who presents to the emergency department for evaluation and treatment of dental pain.  He will be given a prescription for amoxicillin and a few norco. He was encouraged to follow up with the dentist within 2 weeks.  He was advised to return to the ER for symptoms that change or worsen if unable to schedule an appointment.  Pertinent labs & imaging results that were available during my care of the patient were reviewed by me and considered in my medical decision making (see chart for details).  ____________________________________________   FINAL CLINICAL IMPRESSION(S) / ED DIAGNOSES  Final diagnoses:  None    New Prescriptions   No medications on file    If controlled substance prescribed during this visit, 12 month history viewed on the [facility] prior to issuing an initial prescription for Schedule II or III opiod.  Note:  This document was prepared using Dragon voice recognition software and may include unintentional dictation errors.    Chinita Pester, FNP 12/13/17 1352    Sharman Cheek, MD 12/13/17 1455

## 2018-02-13 ENCOUNTER — Emergency Department (HOSPITAL_COMMUNITY)
Admission: EM | Admit: 2018-02-13 | Discharge: 2018-02-14 | Discharge status: Against Medical Advice | Payer: Self-pay | Attending: Emergency Medicine | Admitting: Emergency Medicine

## 2018-02-13 DIAGNOSIS — Z5321 Procedure and treatment not carried out due to patient leaving prior to being seen by health care provider: Secondary | ICD-10-CM | POA: Insufficient documentation

## 2018-02-13 NOTE — ED Notes (Signed)
[place] [employer] in with pt at this time.

## 2018-02-14 NOTE — ED Notes (Signed)
Pt states that he does not want to stay to be seen. Pt ambulated out of department without difficulty.

## 2019-02-01 ENCOUNTER — Emergency Department (HOSPITAL_COMMUNITY)
Admission: EM | Admit: 2019-02-01 | Discharge: 2019-02-01 | Disposition: A | Discharge status: Home / Self Care | Payer: Self-pay | Attending: Emergency Medicine | Admitting: Emergency Medicine

## 2019-02-01 ENCOUNTER — Other Ambulatory Visit: Payer: Self-pay

## 2019-02-01 DIAGNOSIS — Z720 Tobacco use: Secondary | ICD-10-CM

## 2019-02-01 DIAGNOSIS — F17210 Nicotine dependence, cigarettes, uncomplicated: Secondary | ICD-10-CM | POA: Insufficient documentation

## 2019-02-01 DIAGNOSIS — T401X1A Poisoning by heroin, accidental (unintentional), initial encounter: Secondary | ICD-10-CM | POA: Insufficient documentation

## 2019-02-01 MED ORDER — NALOXONE HCL 0.4 MG/ML IJ SOLN: 0.4000 mg | INTRAMUSCULAR | Status: DC | PRN

## 2019-02-01 MED ORDER — NALOXONE HCL 4 MG/0.1ML NA LIQD
1.0000 | Freq: Once | NASAL | Status: AC
  Administered 2019-02-01: 1 via NASAL
  Filled 2019-02-01: qty 4

## 2019-02-01 MED ORDER — NALOXONE HCL 2 MG/2ML IJ SOSY
1.0000 mg | PREFILLED_SYRINGE | Freq: Once | INTRAMUSCULAR | Status: AC
  Administered 2019-02-01: 1 mg via NASAL
  Filled 2019-02-01: qty 2

## 2019-02-01 NOTE — ED Triage Notes (Addendum)
Per EMS, patient from hotel, where girlfriend called stating patient was overdosing on heroin. On arrival, EMS found patient confused and lethargic. Patient alert at this time. Stating "I'm just scared, I don't want to die."

## 2019-02-01 NOTE — ED Provider Notes (Signed)
[facility] DEPT Provider Note   CSN: [id number] Arrival date & time: 02/01/19  1415    History   Chief Complaint Chief Complaint  Patient presents with  . Drug Overdose    HPI    Caleb Ellis is a 28 y.o. male with a PMHx of ADHD, bipolar disorder, and drug abuse, who presents to the ED via EMS with complaints of heroin overdose at unknown time PTA.  Patient states that he used heroin, he is unsure how much or when exactly.  This was sometime prior to arrival.  He has not been given anything per EMS report.  No known aggravating factors other than using heroin.  He also admits to using meth and painkillers, but is unclear when he used those.  He repeatedly apologizes and asks for something to drink.  He denies having any other medical complaints at this time.  He denies that this was a suicide attempt.  He denies feeling suicidal, having SI, HI, or AVH.  He denies alcohol use.  He endorses being a cigarette smoker.  He has no other complaints at this time.  The history is provided by the patient and medical records. No language interpreter was used.  Drug Overdose  Pertinent negatives include no chest pain, no abdominal pain and no shortness of breath.    Past Medical History:  Diagnosis Date  . ADHD (attention deficit hyperactivity disorder)   . Bipolar disorder ([place])   . Drug abuse ([place])   . Outbursts of anger     Patient Active Problem List   Diagnosis Date Noted  . Substance induced mood disorder ([place]) 10/15/2017  . Severe recurrent major depression without psychotic features ([place]) 10/14/2017    No past surgical history on file.      Home Medications    Prior to Admission medications   Medication Sig Start Date End Date Taking? Authorizing Provider  amoxicillin (AMOXIL) 500 MG tablet Take 1 tablet (500 mg total) by mouth 3 (three) times daily. 12/13/17   Triplett, Dessa Phi, FNP    Family History No family history on file.  Social  History Social History   Tobacco Use  . Smoking status: Current Every Day Smoker    Packs/day: 0.50    Types: Cigarettes  . Smokeless tobacco: Never Used  Substance Use Topics  . Alcohol use: Yes    Comment: occ  . Drug use: No     Allergies   Patient has no known allergies.   Review of Systems Review of Systems  Constitutional: Negative for chills and fever.  Respiratory: Negative for shortness of breath.   Cardiovascular: Negative for chest pain.  Gastrointestinal: Negative for abdominal pain, constipation, diarrhea, nausea and vomiting.  Genitourinary: Negative for dysuria and hematuria.  Musculoskeletal: Negative for arthralgias and myalgias.  Skin: Negative for color change.  Allergic/Immunologic: Negative for immunocompromised state.  Neurological: Negative for weakness and numbness.  Psychiatric/Behavioral: Negative for hallucinations and suicidal ideas.   All other systems reviewed and are negative for acute change except as noted in the HPI.    Physical Exam Updated Vital Signs BP (!) 131/97   Pulse 94 Comment: Simultaneous filing. User may not have seen previous data.  Temp 98 F (36.7 C) (Oral)   Resp 12 Comment: Simultaneous filing. User may not have seen previous data.  SpO2 95% Comment: Simultaneous filing. User may not have seen previous data.  Physical Exam Vitals signs and nursing note reviewed.  Constitutional:  General: He is not in acute distress.    Appearance: Normal appearance. He is well-developed. He is not toxic-appearing.     Comments: Afebrile, nontoxic, NAD, repeatedly stating he's sorry and asking for water. Appears to be under the influence of drugs.   HENT:     Head: Normocephalic and atraumatic.  Eyes:     General:        Right eye: No discharge.        Left eye: No discharge.     Conjunctiva/sclera: Conjunctivae normal.     Comments: Pinpoint pupils bilaterally  Neck:     Musculoskeletal: Normal range of motion and neck  supple.  Cardiovascular:     Rate and Rhythm: Normal rate and regular rhythm.     Pulses: Normal pulses.     Heart sounds: Normal heart sounds, S1 normal and S2 normal. No murmur. No friction rub. No gallop.   Pulmonary:     Effort: Pulmonary effort is normal. No respiratory distress.     Breath sounds: Normal breath sounds. No decreased breath sounds, wheezing, rhonchi or rales.     Comments: Maintaining airway well, unlabored respirations.  Abdominal:     General: Bowel sounds are normal. There is no distension.     Palpations: Abdomen is soft. Abdomen is not rigid.     Tenderness: There is no abdominal tenderness. There is no right CVA tenderness, left CVA tenderness, guarding or rebound. Negative signs include Murphy's sign and McBurney's sign.  Musculoskeletal: Normal range of motion.     Comments: MAE x4  Skin:    General: Skin is warm and dry.     Findings: No rash.  Neurological:     Mental Status: He is alert and oriented to person, place, and time.     Sensory: Sensation is intact. No sensory deficit.     Motor: Motor function is intact.     Comments: A&O x3 although sluggish to respond, but able to answer all questions appropriately. Appears to be under the influence of drugs.   Psychiatric:        Attention and Perception: He does not perceive auditory or visual hallucinations.        Mood and Affect: Mood and affect normal.        Behavior: Behavior is slowed.        Thought Content: Thought content does not include homicidal or suicidal ideation. Thought content does not include homicidal or suicidal plan.     Comments: Slowed behavior. Denies SI/HI/AVH      ED Treatments / Results  Labs (all labs ordered are listed, but only abnormal results are displayed) Labs Reviewed - No data to display  EKG None  Radiology No results found.  Procedures Procedures (including critical care time)  Medications Ordered in ED Medications  naloxone (NARCAN) nasal spray 4  mg/0.1 mL (has no administration in time range)  naloxone [facility]) injection 1 mg (1 mg Nasal Given 02/01/19 1654)     Initial Impression / Assessment and Plan / ED Course  I have reviewed the triage vital signs and the nursing notes.  Pertinent labs & imaging results that were available during my care of the patient were reviewed by me and considered in my medical decision making (see chart for details).        28 y.o. male here with heroin OD, unsure how much or exactly when he took it. On exam, pinpoint pupils, repeatedly states he's sorry and that he wants something  to drink. A&O x3 although just slightly sluggish with his responses. Appears to be under the influence of drugs. VSS, in no acute distress. Maintaining his airway and respiratory status well. Has no complaints. Denies SI/HI/AVH. Will monitor for sobriety, doubt need for narcan at this time given that he's maintaining his airway well. Doubt need for labs at this time. Will reassess shortly. Discussed case with my attending Dr. Ralene Bathe who agrees with plan.   4:42 PM Nursing staff reports pt's sats going into the upper 80s when he sleeps; improves when he's awake but he drifts off to sleep easily; will give narcan now.   5:15 PM Pt not requiring O2 now, slightly more awake, still seems to be under the influence of drugs but more alert. Maintaining airway and respirations unlabored, no hypoxia. Will continue to monitor.   6:20 PM Pt continues to maintain airway fine and without desats. He is alert and oriented. Denies any complaints. No SI/HI/AVH. Doubt need for further emergent work up or intervention at this time. Will d/c home with narcan kit, advised smoking and drug cessation. Resource guide given. I explained the diagnosis and have given explicit precautions to return to the ER including for any other new or worsening symptoms. The patient understands and accepts the medical plan as it's been dictated and I have answered their  questions. Discharge instructions concerning home care and prescriptions have been given. The patient is STABLE and is discharged to home in good condition.    Final Clinical Impressions(s) / ED Diagnoses   Final diagnoses:  Accidental overdose of heroin, initial encounter [facility])  Tobacco user    ED Discharge Orders    [address] [place], [place] 02/01/19 1820    Quintella Reichert, MD 02/07/19 1055

## 2019-02-01 NOTE — ED Notes (Signed)
Patient's O2 83% while sleeping. Patient placed on 2L [place]. O2 93% at this time.

## 2019-02-01 NOTE — Discharge Instructions (Addendum)
STOP DOING DRUGS! STOP SMOKING! Use narcan as directed as needed for future drug overdoses but please don't use drugs. Use the list below to find help for your drug use. Return to the ER for emergent changes or worsening symptoms.

## 2019-02-01 NOTE — ED Notes (Signed)
ED Provider at bedside. 

## 2019-05-15 ENCOUNTER — Emergency Department (HOSPITAL_COMMUNITY)
Admission: EM | Admit: 2019-05-15 | Discharge: 2019-05-15 | Disposition: A | Discharge status: Home / Self Care | Payer: Self-pay | Attending: Emergency Medicine | Admitting: Emergency Medicine

## 2019-05-15 ENCOUNTER — Encounter (HOSPITAL_COMMUNITY): Payer: Self-pay | Admitting: *Deleted

## 2019-05-15 ENCOUNTER — Emergency Department (HOSPITAL_COMMUNITY): Payer: Self-pay

## 2019-05-15 ENCOUNTER — Other Ambulatory Visit: Payer: Self-pay

## 2019-05-15 DIAGNOSIS — F321 Major depressive disorder, single episode, moderate: Secondary | ICD-10-CM | POA: Insufficient documentation

## 2019-05-15 DIAGNOSIS — R45851 Suicidal ideations: Secondary | ICD-10-CM | POA: Insufficient documentation

## 2019-05-15 DIAGNOSIS — F32A Depression, unspecified: Secondary | ICD-10-CM

## 2019-05-15 DIAGNOSIS — Z590 Homelessness: Secondary | ICD-10-CM | POA: Insufficient documentation

## 2019-05-15 DIAGNOSIS — R7989 Other specified abnormal findings of blood chemistry: Secondary | ICD-10-CM | POA: Insufficient documentation

## 2019-05-15 DIAGNOSIS — E860 Dehydration: Secondary | ICD-10-CM

## 2019-05-15 DIAGNOSIS — F17210 Nicotine dependence, cigarettes, uncomplicated: Secondary | ICD-10-CM | POA: Insufficient documentation

## 2019-05-15 DIAGNOSIS — F329 Major depressive disorder, single episode, unspecified: Secondary | ICD-10-CM

## 2019-05-15 DIAGNOSIS — Z20828 Contact with and (suspected) exposure to other viral communicable diseases: Secondary | ICD-10-CM | POA: Insufficient documentation

## 2019-05-15 DIAGNOSIS — Z046 Encounter for general psychiatric examination, requested by authority: Secondary | ICD-10-CM | POA: Insufficient documentation

## 2019-05-15 DIAGNOSIS — K047 Periapical abscess without sinus: Secondary | ICD-10-CM | POA: Insufficient documentation

## 2019-05-15 DIAGNOSIS — K029 Dental caries, unspecified: Secondary | ICD-10-CM | POA: Insufficient documentation

## 2019-05-15 DIAGNOSIS — R509 Fever, unspecified: Secondary | ICD-10-CM | POA: Insufficient documentation

## 2019-05-15 HISTORY — DX: Depression, unspecified: F32.A

## 2019-05-15 LAB — RAPID URINE DRUG SCREEN, HOSP PERFORMED
Amphetamines: POSITIVE — AB
Barbiturates: NOT DETECTED
Benzodiazepines: NOT DETECTED
Cocaine: NOT DETECTED
Opiates: POSITIVE — AB
Tetrahydrocannabinol: NOT DETECTED

## 2019-05-15 LAB — URINALYSIS, ROUTINE W REFLEX MICROSCOPIC
Bilirubin Urine: NEGATIVE
Glucose, UA: NEGATIVE mg/dL
Hgb urine dipstick: NEGATIVE
Ketones, ur: NEGATIVE mg/dL
Leukocytes,Ua: NEGATIVE
Nitrite: NEGATIVE
Protein, ur: NEGATIVE mg/dL
Specific Gravity, Urine: 1.013 (ref 1.005–1.030)
pH: 5 (ref 5.0–8.0)

## 2019-05-15 LAB — COMPREHENSIVE METABOLIC PANEL WITH GFR
ALT: 74 U/L — ABNORMAL HIGH (ref 0–44)
AST: 41 U/L (ref 15–41)
Albumin: 4.2 g/dL (ref 3.5–5.0)
Alkaline Phosphatase: 81 U/L (ref 38–126)
Anion gap: 10 (ref 5–15)
BUN: 10 mg/dL (ref 6–20)
CO2: 25 mmol/L (ref 22–32)
Calcium: 8.9 mg/dL (ref 8.9–10.3)
Chloride: 100 mmol/L (ref 98–111)
Creatinine, Ser: 0.96 mg/dL (ref 0.61–1.24)
GFR calc Af Amer: >60 mL/min
GFR calc non Af Amer: >60 mL/min
Glucose, Bld: 139 mg/dL — ABNORMAL HIGH (ref 70–99)
Potassium: 3.5 mmol/L (ref 3.5–5.1)
Sodium: 135 mmol/L (ref 135–145)
Total Bilirubin: 1 mg/dL (ref 0.3–1.2)
Total Protein: 7.4 g/dL (ref 6.5–8.1)

## 2019-05-15 LAB — CBC WITH DIFFERENTIAL/PLATELET
Abs Immature Granulocytes: 0.02 10*3/uL (ref 0.00–0.07)
Basophils Absolute: 0.1 10*3/uL (ref 0.0–0.1)
Basophils Relative: 1 %
Eosinophils Absolute: 0 10*3/uL (ref 0.0–0.5)
Eosinophils Relative: 0 %
HCT: 44.8 % (ref 39.0–52.0)
Hemoglobin: 15.2 g/dL (ref 13.0–17.0)
Immature Granulocytes: 0 %
Lymphocytes Relative: 5 %
Lymphs Abs: 0.5 10*3/uL — ABNORMAL LOW (ref 0.7–4.0)
MCH: 31.1 pg (ref 26.0–34.0)
MCHC: 33.9 g/dL (ref 30.0–36.0)
MCV: 91.8 fL (ref 80.0–100.0)
Monocytes Absolute: 0.4 10*3/uL (ref 0.1–1.0)
Monocytes Relative: 4 %
Neutro Abs: 7.8 10*3/uL — ABNORMAL HIGH (ref 1.7–7.7)
Neutrophils Relative %: 90 %
Platelets: 218 10*3/uL (ref 150–400)
RBC: 4.88 MIL/uL (ref 4.22–5.81)
RDW: 11.8 % (ref 11.5–15.5)
WBC: 8.7 10*3/uL (ref 4.0–10.5)
nRBC: 0 % (ref 0.0–0.2)

## 2019-05-15 LAB — SARS CORONAVIRUS 2 BY RT PCR (HOSPITAL ORDER, PERFORMED IN ~~LOC~~ HOSPITAL LAB): SARS Coronavirus 2: NEGATIVE

## 2019-05-15 LAB — LACTIC ACID, PLASMA
Lactic Acid, Venous: 3.4 mmol/L (ref 0.5–1.9)
Lactic Acid, Venous: 1 mmol/L (ref 0.5–1.9)

## 2019-05-15 LAB — ETHANOL: Alcohol, Ethyl (B): <10 mg/dL

## 2019-05-15 MED ORDER — SODIUM CHLORIDE 0.9 % IV BOLUS
1000.0000 mL | Freq: Once | INTRAVENOUS | Status: AC
  Administered 2019-05-15: 1000 mL via INTRAVENOUS

## 2019-05-15 MED ORDER — VANCOMYCIN HCL IN DEXTROSE 1-5 GM/200ML-% IV SOLN
1000.0000 mg | Freq: Once | INTRAVENOUS | Status: AC
  Administered 2019-05-15: 1000 mg via INTRAVENOUS
  Filled 2019-05-15: qty 200

## 2019-05-15 MED ORDER — AMOXICILLIN 500 MG PO CAPS: 500.0000 mg | ORAL_CAPSULE | Freq: Three times a day (TID) | ORAL | 0 refills | Status: DC

## 2019-05-15 MED ORDER — SODIUM CHLORIDE 0.9 % IV SOLN
2.0000 g | Freq: Once | INTRAVENOUS | Status: AC
  Administered 2019-05-15: 2 g via INTRAVENOUS
  Filled 2019-05-15: qty 2

## 2019-05-15 MED ORDER — AMOXICILLIN 500 MG PO CAPS
500.0000 mg | ORAL_CAPSULE | Freq: Three times a day (TID) | ORAL | Status: DC
  Administered 2019-05-15: 500 mg via ORAL
  Filled 2019-05-15: qty 1

## 2019-05-15 NOTE — ED Notes (Signed)
Pt left without discharge papers, made contact with pt , and stated that he would  Come back and get his discharge.

## 2019-05-15 NOTE — BH Assessment (Signed)
Writer requested to have the [place] in the room.

## 2019-05-15 NOTE — BH Assessment (Signed)
Per Delphia Grates, NP - patient does not meet criteria for inpatient psychiatric hospitalization.  Writer informed the Charge Nurse of the disposition and faxed referrals for the patient to [phone]

## 2019-05-15 NOTE — ED Triage Notes (Signed)
Per EMS: Pt presents to the ED with complaint of dental pain. Pt is reportedly homeless and pt is coming from [employer].  Pt reportedly had an abscessed tooth a year ago and received IV antibiotics and told to followup with the dentist.  Pt reportedly has a fever 102.6 and was given 1000mg  of tylenol.  HR 118 SPO2 97% on RA 122/84 BP

## 2019-05-15 NOTE — ED Notes (Signed)
Pt presents with dental pain, upon assessment he states he is depressed and wants to die, relationship breakup, homeless, wants to jump off bridge to kill self, states life is not worth living.

## 2019-05-15 NOTE — BH Assessment (Signed)
Tele Assessment Note   Patient Name: Caleb Ellis MRN: [id number] Referring Physician:  Location of Patient: [facility] Location of Provider: [facility]  Patient is a 28 year old male.  Patient denies SI/HI/Psychosis.  Patient was brought to the ED by EMD due to depression and dental pain.   Patient reports that he is better since he was able to have food and antibiotics for his tooth.  Patient reports that he has been to talk to his girlfriend on the phone and he feels as if they will get back together.  He lives with his girlfriend and she has allowed him to come back home.   In 2019 her was hospitalized due to depression at [facility].  Patient denies any prior suicide attempt.  Patient denies current outpatient therapy.  Patient reports that he stopped taking his psychiatric medication due to the cost.  Patient reports that he does not have any insurance.   He has been unemployed for a couple of years.  He reports using heroin.  He uses daily.  He reports that his longest period of sobriety was for 4 days.  Denies DWI.  Denies any detox or inpatient substance abuse facility.   Denies seizures or DT's.  The first time that he used heroin was in 2015.  He has been using since he was 28 years old.  Denies any withdrawal symptoms.    Diagnosis: Opiate Abuse, Major Depressive Disorder   Past Medical History:  Past Medical History:  Diagnosis Date  . ADHD (attention deficit hyperactivity disorder)   . Bipolar disorder ([place])   . Depression   . Drug abuse ([place])   . Outbursts of anger     History reviewed. No pertinent surgical history.  Family History: No family history on file.  Social History:  reports that he has been smoking cigarettes. He has been smoking about 0.50 packs per day. He has never used smokeless tobacco. He reports current alcohol use. He reports that he does not use drugs.  Additional Social History:  Alcohol / Drug Use History of alcohol / drug use?: Yes Longest period of sobriety  (when/how long): 5 days Negative Consequences of Use: Financial, Personal relationships, Work / Youth worker Withdrawal Symptoms: (None Reported) Substance #1 Name of Substance 1: Heroin 1 - Age of First Use: 24 1 - Amount (size/oz): vaires; pt reports that he uses as much as possible 1 - Frequency: Daily 1 - Duration: 5 years 1 - Last Use / Amount: Yesterday  CIWA: CIWA-Ar BP: 112/80 Pulse Rate: 99 COWS: Clinical Opiate Withdrawal Scale (COWS) Resting Pulse Rate: Pulse Rate 101-120 Sweating: No report of chills or flushing Restlessness: Reports difficulty sitting still, but is able to do so Pupil Size: Pupils pinned or normal size for room light Bone or Joint Aches: Not present Runny Nose or Tearing: Not present GI Upset: No GI symptoms Tremor: No tremor Yawning: Yawning three or more times during assessment Anxiety or Irritability: Patient reports increasing irritability or anxiousness Gooseflesh Skin: Skin is smooth COWS Total Score: 6  Allergies: No Known Allergies  Home Medications: (Not in a hospital admission)   OB/GYN Status:  No LMP for male patient.  General Assessment Data Location of Assessment: [facility] Is this a Tele or Face-to-Face Assessment?: Tele Assessment Is this an Initial Assessment or a Re-assessment for this encounter?: Initial Assessment Patient Accompanied by:: (Alone) Language Other than English: (None Reported) Living Arrangements: Other (Comment) What gender do you identify as?: Male Marital status: Single Maiden name: (NA)  Pregnancy Status: No Living Arrangements: Spouse/significant other Admission Status: Voluntary Is patient capable of signing voluntary admission?: Yes Referral Source: Self/Family/Friend Insurance type: (None Reported)     Crisis Care Plan Living Arrangements: Spouse/significant other  Education Status Is patient currently in school?: No Is the patient employed, unemployed or receiving disability?: Unemployed  Risk  to self with the past 6 months Suicidal Ideation: No Has patient been a risk to self within the past 6 months prior to admission? : No Suicidal Intent: No Has patient had any suicidal intent within the past 6 months prior to admission? : No Is patient at risk for suicide?: No Suicidal Plan?: No Has patient had any suicidal plan within the past 6 months prior to admission? : No Access to Means: No What has been your use of drugs/alcohol within the last 12 months?: (Heroin) Previous Attempts/Gestures: No How many times?: (NA) Other Self Harm Risks: (None Reported) Triggers for Past Attempts: (NA) Intentional Self Injurious Behavior: None Recent stressful life event(s): (Dental pain, argument with girlfriend ) Persecutory voices/beliefs?: No Depression: Yes Depression Symptoms: Guilt, Loss of interest in usual pleasures, Feeling worthless/self pity Substance abuse history and/or treatment for substance abuse?: Yes Suicide prevention information given to non-admitted patients: Not applicable  Risk to Others within the past 6 months Homicidal Ideation: No Does patient have any lifetime risk of violence toward others beyond the six months prior to admission? : No Thoughts of Harm to Others: No Comment - Thoughts of Harm to Others: na Current Homicidal Intent: No Current Homicidal Plan: No Access to Homicidal Means: No Identified Victim: na History of harm to others?: No Assessment of Violence: None Noted Violent Behavior Description: (na) Does patient have access to weapons?: No Criminal Charges Pending?: No Does patient have a court date: No Is patient on probation?: No  Psychosis Hallucinations: None noted Delusions: None noted  Mental Status Report Appearance/Hygiene: Disheveled, In scrubs Eye Contact: Good Motor Activity: Freedom of movement Speech: Logical/coherent Level of Consciousness: Alert Mood: Depressed Affect: Appropriate to circumstance Anxiety Level:  None Thought Processes: Coherent, Relevant Judgement: Unimpaired Orientation: Person, Place, Time, Situation  Cognitive Functioning Concentration: Normal Memory: Recent Intact, Remote Intact Is patient IDD: No Insight: Fair Impulse Control: Poor Appetite: Fair Have you had any weight changes? : No Change Sleep: Decreased Total Hours of Sleep: 5 Vegetative Symptoms: Decreased grooming  ADLScreening [facility] Assessment Services) Patient's cognitive ability adequate to safely complete daily activities?: Yes Patient able to express need for assistance with ADLs?: Yes Independently performs ADLs?: Yes (appropriate for developmental age)  Prior Inpatient Therapy Prior Inpatient Therapy: No  Prior Outpatient Therapy Prior Outpatient Therapy: Yes Prior Therapy Dates: (2019) Prior Therapy Facilty/Provider(s): Stopped going to his appts due to financial problems Reason for Treatment: Medication mgt Does patient have an ACCT team?: No Does patient have Intensive In-House Services?  : No Does patient have [facility] services? : No Does patient have P4CC services?: No  ADL Screening (condition at time of admission) Patient's cognitive ability adequate to safely complete daily activities?: Yes Patient able to express need for assistance with ADLs?: Yes Independently performs ADLs?: Yes (appropriate for developmental age)         Values / Beliefs Cultural Requests During Hospitalization: None Spiritual Requests During Hospitalization: None Consults Spiritual Care Consult Needed: No Social Work Consult Needed: No Merchant navy officerAdvance Directives (For Healthcare) Does Patient Have a Medical Advance Directive?: No Would patient like information on creating a medical advance directive?: No - Patient declined  Disposition: Per Assunta FoundShuvon Rankin, NP - discharge with resources  Disposition Initial Assessment Completed for this Encounter: Yes  This service was provided via telemedicine using a  2-way, interactive audio and video technology.  Names of all persons participating in this telemedicine service and their role in this encounter. Name: Clyde CanterburyAva L Clare Fennimore  Role: MA, LCAS-A             Phillip HealStevenson, Brandalynn Ofallon [place] 05/15/2019 5:46 PM

## 2019-05-15 NOTE — ED Notes (Signed)
Date and time results received: 05/15/19 1344 (use smartphrase ".now" to insert current time)  Test: Lactic Acid Critical Value: 3.4  Name of Provider Notified: Dr. Ashok Cordia  Orders Received? Or Actions Taken?:

## 2019-05-15 NOTE — Discharge Instructions (Addendum)
It was our pleasure to provide your ER care today - we hope that you feel better.  Take antibiotic as prescribed. Take ibuprofen or aleve as need for pain.   Follow up with dentist in the next couple of days - call office tomorrow AM to arrange appointment.   Avoid any drug use - use resources provided by behavioral health for both substance abuse follow up, as well as for depression and counseling resources.   Return to ER if worse, new or worsening symptoms, increased swelling, severe pain, weak/faint, or other concern.

## 2019-05-15 NOTE — ED Provider Notes (Addendum)
[facility]-EMERGENCY DEPT Provider Note   CSN: [id number] Arrival date & time: 05/15/19  1141     History   Chief Complaint Chief Complaint  Patient presents with  . Dental Pain  . Medical Clearance    HPI Caleb Ellis Kavanaugh is a 28 y.o. male.     Patient with hx ivda, depression c/o worsening depression in past few days. Symptoms acute onset, moderate, persistent, constant. States is due to breakup of a 4 year relationship with significant other. Pt also noted w fever to 102. Fever acute onset today. C/o dental decay/abscess, left upper, and associated gum swelling. No facial swelling or redness. Denies throat pain or neck pain. No trouble breathing or swallowing. Pt w hx ivda, denies pain or redness at any injection sites. Denies cough or sore throat. No sinus drainage or pain. No headache. No neck pain or stiffness. No known covid+ exposure. No abd pain or nvd. No dysuria or gu c/o. No rash. No extremity pain or swelling. Denies attempt to harm self. No thoughts of harm to others. No SI.   The history is provided by the patient.  Dental Pain Associated symptoms: fever   Associated symptoms: no headaches and no neck pain     Past Medical History:  Diagnosis Date  . ADHD (attention deficit hyperactivity disorder)   . Bipolar disorder (HCC)   . Depression   . Drug abuse (HCC)   . Outbursts of anger     Patient Active Problem List   Diagnosis Date Noted  . Substance induced mood disorder (HCC) 10/15/2017  . Severe recurrent major depression without psychotic features (HCC) 10/14/2017    History reviewed. No pertinent surgical history.      Home Medications    Prior to Admission medications   Medication Sig Start Date End Date Taking? Authorizing Provider  amoxicillin (AMOXIL) 500 MG tablet Take 1 tablet (500 mg total) by mouth 3 (three) times daily. Patient not taking: Reported on 02/01/2019 12/13/17   Chinita Pesterriplett, Cari B, FNP    Family History No  family history on file.  Social History Social History   Tobacco Use  . Smoking status: Current Every Day Smoker    Packs/day: 0.50    Types: Cigarettes  . Smokeless tobacco: Never Used  Substance Use Topics  . Alcohol use: Yes    Comment: occ  . Drug use: No     Allergies   Patient has no known allergies.   Review of Systems Review of Systems  Constitutional: Positive for fever.  HENT: Positive for dental problem. Negative for sore throat.   Eyes: Negative for pain and redness.  Respiratory: Negative for cough and shortness of breath.   Cardiovascular: Negative for chest pain and leg swelling.  Gastrointestinal: Negative for abdominal pain, diarrhea and vomiting.  Endocrine: Negative for polyuria.  Genitourinary: Negative for dysuria and flank pain.  Musculoskeletal: Negative for back pain and neck pain.  Skin: Negative for rash.  Neurological: Negative for headaches.  Hematological: Does not bruise/bleed easily.  Psychiatric/Behavioral: Negative for confusion.     Physical Exam Updated Vital Signs BP 116/85 (BP Location: Left Arm)   Pulse (!) 123   Temp (!) 102.7 F (39.3 C) (Oral)   Resp 20   SpO2 97%   Physical Exam Vitals signs and nursing note reviewed.  Constitutional:      Appearance: Normal appearance. He is well-developed.  HENT:     Head: Atraumatic.     Nose: Nose normal.  Mouth/Throat:     Mouth: Mucous membranes are moist.     Pharynx: Oropharynx is clear.     Comments: Poor dental hygiene, multiple caries. Left upper dental decay w associated gum swelling and tenderness. No trismus. Pharynx normal. No swelling, pain or tenderness to floor of mouth or neck.  Eyes:     General: No scleral icterus.    Conjunctiva/sclera: Conjunctivae normal.     Pupils: Pupils are equal, round, and reactive to light.  Neck:     Musculoskeletal: Normal range of motion and neck supple. No neck rigidity.     Trachea: No tracheal deviation.     Comments: No  stiffness or rigidity.  Cardiovascular:     Rate and Rhythm: Regular rhythm. Tachycardia present.     Pulses: Normal pulses.     Heart sounds: Normal heart sounds. No murmur. No friction rub. No gallop.   Pulmonary:     Effort: Pulmonary effort is normal. No accessory muscle usage or respiratory distress.     Breath sounds: Normal breath sounds.  Abdominal:     General: Bowel sounds are normal. There is no distension.     Palpations: Abdomen is soft.     Tenderness: There is no abdominal tenderness. There is no guarding.  Genitourinary:    Comments: No cva tenderness. Musculoskeletal:        General: No swelling or tenderness.     Right lower leg: No edema.     Left lower leg: No edema.     Comments: CTLS spine, non tender, aligned, no step off. Good rom bil extremities without pain, no focal sts or focal bony tenderness.   Lymphadenopathy:     Cervical: No cervical adenopathy.  Skin:    General: Skin is warm and dry.     Findings: No rash.     Comments: No skin lesions or rash. No jwl, no oslers nodes, no splinter hem or petechial/purpuric lesions.   Neurological:     Mental Status: He is alert.     Comments: Alert, speech clear.   Psychiatric:        Mood and Affect: Mood normal.      ED Treatments / Results  Labs (all labs ordered are listed, but only abnormal results are displayed) Results for orders placed or performed during the hospital encounter of 05/15/19  SARS Coronavirus 2 [facility] order, Performed in [place] hospital lab) Nasopharyngeal Nasopharyngeal Swab   Specimen: Nasopharyngeal Swab  Result Value Ref Range   SARS Coronavirus 2 NEGATIVE NEGATIVE  Lactic acid, plasma  Result Value Ref Range   Lactic Acid, Venous 3.4 (HH) 0.5 - 1.9 mmol/L  Lactic acid, plasma  Result Value Ref Range   Lactic Acid, Venous 1.0 0.5 - 1.9 mmol/L  CBC WITH DIFFERENTIAL  Result Value Ref Range   WBC 8.7 4.0 - 10.5 K/uL   RBC 4.88 4.22 - 5.81 MIL/uL   Hemoglobin 15.2  13.0 - 17.0 g/dL   HCT 44.8 39.0 - 52.0 %   MCV 91.8 80.0 - 100.0 fL   MCH 31.1 26.0 - 34.0 pg   MCHC 33.9 30.0 - 36.0 g/dL   RDW 11.8 11.5 - 15.5 %   Platelets 218 150 - 400 K/uL   nRBC 0.0 0.0 - 0.2 %   Neutrophils Relative % 90 %   Neutro Abs 7.8 (H) 1.7 - 7.7 K/uL   Lymphocytes Relative 5 %   Lymphs Abs 0.5 (L) 0.7 - 4.0 K/uL   Monocytes  Relative 4 %   Monocytes Absolute 0.4 0.1 - 1.0 K/uL   Eosinophils Relative 0 %   Eosinophils Absolute 0.0 0.0 - 0.5 K/uL   Basophils Relative 1 %   Basophils Absolute 0.1 0.0 - 0.1 K/uL   Immature Granulocytes 0 %   Abs Immature Granulocytes 0.02 0.00 - 0.07 K/uL  Comprehensive metabolic panel  Result Value Ref Range   Sodium 135 135 - 145 mmol/L   Potassium 3.5 3.5 - 5.1 mmol/L   Chloride 100 98 - 111 mmol/L   CO2 25 22 - 32 mmol/L   Glucose, Bld 139 (H) 70 - 99 mg/dL   BUN 10 6 - 20 mg/dL   Creatinine, Ser 9.140.96 0.61 - 1.24 mg/dL   Calcium 8.9 8.9 - 78.210.3 mg/dL   Total Protein 7.4 6.5 - 8.1 g/dL   Albumin 4.2 3.5 - 5.0 g/dL   AST 41 15 - 41 U/L   ALT 74 (H) 0 - 44 U/L   Alkaline Phosphatase 81 38 - 126 U/L   Total Bilirubin 1.0 0.3 - 1.2 mg/dL   GFR calc non Af Amer >60 >60 mL/min   GFR calc Af Amer >60 >60 mL/min   Anion gap 10 5 - 15  Urinalysis, Routine w reflex microscopic  Result Value Ref Range   Color, Urine YELLOW YELLOW   APPearance CLEAR CLEAR   Specific Gravity, Urine 1.013 1.005 - 1.030   pH 5.0 5.0 - 8.0   Glucose, UA NEGATIVE NEGATIVE mg/dL   Hgb urine dipstick NEGATIVE NEGATIVE   Bilirubin Urine NEGATIVE NEGATIVE   Ketones, ur NEGATIVE NEGATIVE mg/dL   Protein, ur NEGATIVE NEGATIVE mg/dL   Nitrite NEGATIVE NEGATIVE   Leukocytes,Ua NEGATIVE NEGATIVE  Ethanol  Result Value Ref Range   Alcohol, Ethyl (B) <10 <10 mg/dL  Rapid urine drug screen (hospital performed)  Result Value Ref Range   Opiates POSITIVE (A) NONE DETECTED   Cocaine NONE DETECTED NONE DETECTED   Benzodiazepines NONE DETECTED NONE DETECTED    Amphetamines POSITIVE (A) NONE DETECTED   Tetrahydrocannabinol NONE DETECTED NONE DETECTED   Barbiturates NONE DETECTED NONE DETECTED   Dg Chest Port 1 View  Result Date: 05/15/2019 CLINICAL DATA:  Fever and dental pain EXAM: PORTABLE CHEST 1 VIEW COMPARISON:  None. FINDINGS: The heart size and mediastinal contours are within normal limits. Both lungs are clear. The visualized skeletal structures are unremarkable. IMPRESSION: No active disease. Electronically Signed   By: Marlan Palauharles  Clark M.D.   On: 05/15/2019 13:45    EKG None  Radiology [facility]  Result Date: 05/15/2019 CLINICAL DATA:  Fever and dental pain EXAM: PORTABLE CHEST 1 VIEW COMPARISON:  None. FINDINGS: The heart size and mediastinal contours are within normal limits. Both lungs are clear. The visualized skeletal structures are unremarkable. IMPRESSION: No active disease. Electronically Signed   By: Marlan Palauharles  Clark M.D.   On: 05/15/2019 13:45    Procedures Procedures (including critical care time)  Medications Ordered in ED Medications  sodium chloride 0.9 % bolus 1,000 mL (has no administration in time range)     Initial Impression / Assessment and Plan / ED Course  I have reviewed the triage vital signs and the nursing notes.  Pertinent labs & imaging results that were available during my care of the patient were reviewed by me and considered in my medical decision making (see chart for details).  Iv ns bolus. Cultures sent. Stat labs. Pcxr.   Reviewed nursing notes and prior  charts for additional history.   Behavioral health team consulted.   Labs reviewed by me - initial lactate is high, 3.4. iv ns boluses. Iv antibiotics given.  covid test is negative.  Caleb Ellis Desena was evaluated in Emergency Department on 05/15/2019 for the symptoms described in the history of present illness. He was evaluated in the context of the global COVID-19 pandemic, which necessitated consideration that the patient might be  at risk for infection with the SARS-CoV-2 virus that causes COVID-19. Institutional protocols and algorithms that pertain to the evaluation of patients at risk for COVID-19 are in a state of rapid change based on information released by regulatory bodies including the CDC and federal and state organizations. These policies and algorithms were followed during the patient's care in the ED.  Repeat lactate pending.   CXR reviewed by me - no pna.   BH evaluation pending.   Repeat lactate is normal.   On recheck, vitals normal. No new symptoms, feels improved w ivf - pt states was feeling dehydrated earlier, and relatively poor po intake for past day.   Repeat lactate is normal, wbc normal, vitals normal, covid neg - it is posible fever is related to dental issues/dental abscess.  Cultures sent/pending.  Patient currently appears improved, and [facility] Will keep on amoxicillin for dental infection.   CRITICAL CARE RE: acute febrile illness with elevated lactate, r/o sepsis, depression with SI. Performed by: Suzi RootsKevin E Toriana Sponsel Total critical care time: 45 minutes Critical care time was exclusive of separately billable procedures and treating other patients. Critical care was necessary to treat or prevent imminent or life-threatening deterioration. Critical care was time spent personally by me on the following activities: development of treatment plan with patient and/or surrogate as well as nursing, discussions with consultants, evaluation of patient's response to treatment, examination of patient, obtaining history from patient or surrogate, ordering and performing treatments and interventions, ordering and review of laboratory studies, ordering and review of radiographic studies, pulse oximetry and re-evaluation of patient's condition.  BH team has evaluated and indicates pt is psychiatrically clear for d/c - see note below.  Per Denice BorsShuvon, NP -  patient does not meet criteria for inpatient psychiatric hospitalization.  Writer informed the Charge Nurse of the disposition and faxed referrals for the patient to [phone]  On recheck, pt indicates is feeling much improved. States breakup is stressful, and was feeling down, but states now is feeling much better after talking about it. Patient denies any thoughts of harm to self or others. Pt exhibits normal mood and affect. Pt is tolerating po, has eaten/drank, ambulatory about ED.   BH team has provided outpt resources. Patient requests d/c.   Patient currently appears stable for d/c.   Referred to close outpt dental f/u. abx rx given. Also given bh resoureces.  Return precautions provided.    Final Clinical Impressions(s) / ED Diagnoses   Final diagnoses:  None    ED Discharge Orders    None          Cathren LaineSteinl, Jeremie Giangrande, MD 05/15/19 [phone]

## 2019-05-19 ENCOUNTER — Telehealth (HOSPITAL_BASED_OUTPATIENT_CLINIC_OR_DEPARTMENT_OTHER): Payer: Self-pay | Admitting: Emergency Medicine

## 2019-05-20 LAB — CULTURE, BLOOD (ROUTINE X 2)
Culture: NO GROWTH
Special Requests: ADEQUATE

## 2019-05-21 LAB — CULTURE, BLOOD (ROUTINE X 2): Special Requests: ADEQUATE

## 2019-05-22 ENCOUNTER — Telehealth: Payer: Self-pay

## 2019-05-22 NOTE — ED Notes (Signed)
Lab called w 1 out of 4 bottles bc pos , took 4 days to grow  md notified  No additional treatment needed  s Jontavia Leatherbury rn

## 2019-05-22 NOTE — Telephone Encounter (Signed)
[facility] to follow up with pt on [facility] report on from ED 05/15/2019 per [place] D

## 2019-07-27 ENCOUNTER — Other Ambulatory Visit: Payer: Self-pay

## 2019-07-27 ENCOUNTER — Encounter (HOSPITAL_COMMUNITY): Payer: Self-pay | Admitting: Emergency Medicine

## 2019-07-27 ENCOUNTER — Inpatient Hospital Stay (HOSPITAL_COMMUNITY)
Admission: EM | Admit: 2019-07-27 | Discharge: 2019-07-30 | DRG: 864 | Disposition: A | Discharge status: Other Acute Inpatient Hospital | Payer: Self-pay | Attending: Internal Medicine | Admitting: Internal Medicine

## 2019-07-27 DIAGNOSIS — F909 Attention-deficit hyperactivity disorder, unspecified type: Secondary | ICD-10-CM | POA: Diagnosis present

## 2019-07-27 DIAGNOSIS — R7401 Elevation of levels of liver transaminase levels: Secondary | ICD-10-CM | POA: Diagnosis present

## 2019-07-27 DIAGNOSIS — R45851 Suicidal ideations: Secondary | ICD-10-CM | POA: Diagnosis present

## 2019-07-27 DIAGNOSIS — R000 Tachycardia, unspecified: Secondary | ICD-10-CM | POA: Diagnosis present

## 2019-07-27 DIAGNOSIS — Z716 Tobacco abuse counseling: Secondary | ICD-10-CM

## 2019-07-27 DIAGNOSIS — F1990 Other psychoactive substance use, unspecified, uncomplicated: Secondary | ICD-10-CM | POA: Diagnosis present

## 2019-07-27 DIAGNOSIS — Z20828 Contact with and (suspected) exposure to other viral communicable diseases: Secondary | ICD-10-CM | POA: Diagnosis present

## 2019-07-27 DIAGNOSIS — B1920 Unspecified viral hepatitis C without hepatic coma: Secondary | ICD-10-CM | POA: Diagnosis present

## 2019-07-27 DIAGNOSIS — F1114 Opioid abuse with opioid-induced mood disorder: Secondary | ICD-10-CM | POA: Diagnosis present

## 2019-07-27 DIAGNOSIS — F17210 Nicotine dependence, cigarettes, uncomplicated: Secondary | ICD-10-CM | POA: Diagnosis present

## 2019-07-27 DIAGNOSIS — R509 Fever, unspecified: Principal | ICD-10-CM | POA: Diagnosis present

## 2019-07-27 DIAGNOSIS — F1994 Other psychoactive substance use, unspecified with psychoactive substance-induced mood disorder: Secondary | ICD-10-CM | POA: Diagnosis present

## 2019-07-27 DIAGNOSIS — F332 Major depressive disorder, recurrent severe without psychotic features: Secondary | ICD-10-CM | POA: Diagnosis present

## 2019-07-27 DIAGNOSIS — R768 Other specified abnormal immunological findings in serum: Secondary | ICD-10-CM | POA: Diagnosis present

## 2019-07-27 DIAGNOSIS — F319 Bipolar disorder, unspecified: Secondary | ICD-10-CM | POA: Diagnosis present

## 2019-07-27 LAB — RAPID URINE DRUG SCREEN, HOSP PERFORMED
Amphetamines: POSITIVE — AB
Barbiturates: NOT DETECTED
Benzodiazepines: NOT DETECTED
Cocaine: NOT DETECTED
Opiates: NOT DETECTED
Tetrahydrocannabinol: NOT DETECTED

## 2019-07-27 MED ORDER — ONDANSETRON HCL 4 MG PO TABS: 4.0000 mg | ORAL_TABLET | Freq: Three times a day (TID) | ORAL | Status: DC | PRN

## 2019-07-27 MED ORDER — NICOTINE 21 MG/24HR TD PT24
21.0000 mg | MEDICATED_PATCH | Freq: Every day | TRANSDERMAL | Status: DC
  Administered 2019-07-28 (×2): 21 mg via TRANSDERMAL
  Filled 2019-07-27 (×3): qty 1

## 2019-07-27 MED ORDER — ACETAMINOPHEN 325 MG PO TABS
650.0000 mg | ORAL_TABLET | ORAL | Status: DC | PRN
  Administered 2019-07-28: 16:00:00 650 mg via ORAL
  Filled 2019-07-27 (×2): qty 2

## 2019-07-27 MED ORDER — ALUM & MAG HYDROXIDE-SIMETH 200-200-20 MG/5ML PO SUSP: 30.0000 mL | Freq: Four times a day (QID) | ORAL | Status: DC | PRN

## 2019-07-27 NOTE — ED Provider Notes (Signed)
[facility]-EMERGENCY DEPT Provider Note   CSN: [id number] Arrival date & time: 07/27/19  2220     History   Chief Complaint Chief Complaint  Patient presents with  . Suicidal    HPI Caleb Ellis is a 28 y.o. male.  Who presents emergency department with chief complaint of suicidal ideation.  He is a past medical history of polysubstance abuse including methamphetamine and heroin but states he uses "anything I can get my hands on."  Patient states that he feels like his life is just going downhill.  He got into an argument with his girlfriend tonight who broke up with him.  Patient is very tearful which is likely the reason his heart rate is elevated.  He denies ingestion of any substances or overdose.  He states that he has been thinking about hurting himself all week.  He states that he wants to "cut myself" he states "anything I can do to get myself out of this pain and end it."  He denies any homicidal ideation, audiovisual hallucinations.  He has a previous history of major depressive disorder, substance induced mood disorder and previous psychiatric hospitalizations.     The history is provided by the patient.    Past Medical History:  Diagnosis Date  . ADHD (attention deficit hyperactivity disorder)   . Bipolar disorder (HCC)   . Depression   . Drug abuse (HCC)   . Outbursts of anger     Patient Active Problem List   Diagnosis Date Noted  . Substance induced mood disorder (HCC) 10/15/2017  . Severe recurrent major depression without psychotic features (HCC) 10/14/2017    History reviewed. No pertinent surgical history.      Home Medications    Prior to Admission medications   Medication Sig Start Date End Date Taking? Authorizing Provider  acetaminophen (TYLENOL) 500 MG tablet Take 1,000 mg by mouth every 6 (six) hours as needed for moderate pain.   Yes [provider]  amoxicillin (AMOXIL) 500 MG capsule Take 1 capsule (500 mg  total) by mouth 3 (three) times daily. Patient not taking: Reported on 07/27/2019 05/15/19   Cathren Laine, MD  amoxicillin (AMOXIL) 500 MG tablet Take 1 tablet (500 mg total) by mouth 3 (three) times daily. Patient not taking: Reported on 02/01/2019 12/13/17   Chinita Pester, FNP    Family History History reviewed. No pertinent family history.  Social History Social History   Tobacco Use  . Smoking status: Current Every Day Smoker    Packs/day: 0.50    Types: Cigarettes  . Smokeless tobacco: Never Used  Substance Use Topics  . Alcohol use: Yes    Comment: occ  . Drug use: Yes    Types: Marijuana, Methamphetamines    Comment: Heroine     Allergies   Patient has no known allergies.   Review of Systems Review of Systems  Ten systems reviewed and are negative for acute change, except as noted in the HPI.   Physical Exam Updated Vital Signs BP (!) 124/107 (BP Location: Left Arm)   Pulse (!) 121   Temp 98.7 F (37.1 C) (Oral)   Resp 18   Ht 5\' 8"  (1.727 m)   Wt 63.5 kg   SpO2 100%   BMI 21.29 kg/m   Physical Exam Vitals signs and nursing note reviewed.  Constitutional:      General: He is not in acute distress.    Appearance: He is well-developed. He is not diaphoretic.  Comments: Tearful  HENT:     Head: Normocephalic and atraumatic.  Eyes:     General: No scleral icterus.    Conjunctiva/sclera: Conjunctivae normal.  Neck:     Musculoskeletal: Normal range of motion and neck supple.  Cardiovascular:     Rate and Rhythm: Normal rate and regular rhythm.     Heart sounds: Normal heart sounds.  Pulmonary:     Effort: Pulmonary effort is normal. No respiratory distress.     Breath sounds: Normal breath sounds.  Abdominal:     Palpations: Abdomen is soft.     Tenderness: There is no abdominal tenderness.  Skin:    General: Skin is warm and dry.  Neurological:     Mental Status: He is alert.  Psychiatric:        Behavior: Behavior normal.      ED  Treatments / Results  Labs (all labs ordered are listed, but only abnormal results are displayed) Labs Reviewed  COMPREHENSIVE METABOLIC PANEL  ETHANOL  SALICYLATE LEVEL  ACETAMINOPHEN LEVEL  CBC  RAPID URINE DRUG SCREEN, HOSP PERFORMED    EKG None  Radiology No results found.  Procedures Procedures (including critical care time)  Medications Ordered in ED Medications - No data to display   Initial Impression / Assessment and Plan / ED Course  I have reviewed the triage vital signs and the nursing notes.  Pertinent labs & imaging results that were available during my care of the patient were reviewed by me and considered in my medical decision making (see chart for details).         Patient presents for Suicidal ideation and depression . He has a hx of Polysubstance abuse. The patient is notably tachycardic, however, he has been sobbing through the HPI. During move to TCU patient developed fever. I have added on Covid testing and given sign out to [employer] His labs are pending and he is not currently medically clear.  Final Clinical Impressions(s) / ED Diagnoses   Final diagnoses:  None    ED Discharge Orders    None       Margarita Mail, PA-C 07/28/19 1620    Julianne Rice, MD 07/30/19 2225

## 2019-07-27 NOTE — ED Triage Notes (Signed)
Pt reports having suicidal ideation for the last few hrs with plan of cutting wrists. Pt reports breaking up with girlfriend tonight.

## 2019-07-28 ENCOUNTER — Emergency Department (HOSPITAL_COMMUNITY): Payer: Self-pay

## 2019-07-28 DIAGNOSIS — R509 Fever, unspecified: Principal | ICD-10-CM

## 2019-07-28 LAB — CBC
HCT: 46.9 % (ref 39.0–52.0)
HCT: 48 % (ref 39.0–52.0)
Hemoglobin: 15.3 g/dL (ref 13.0–17.0)
Hemoglobin: 15.6 g/dL (ref 13.0–17.0)
MCH: 29.7 pg (ref 26.0–34.0)
MCH: 29.8 pg (ref 26.0–34.0)
MCHC: 32.5 g/dL (ref 30.0–36.0)
MCHC: 32.6 g/dL (ref 30.0–36.0)
MCV: 91.4 fL (ref 80.0–100.0)
MCV: 91.4 fL (ref 80.0–100.0)
Platelets: 206 10*3/uL (ref 150–400)
Platelets: 216 10*3/uL (ref 150–400)
RBC: 5.13 MIL/uL (ref 4.22–5.81)
RBC: 5.25 MIL/uL (ref 4.22–5.81)
RDW: 12.1 % (ref 11.5–15.5)
RDW: 12.2 % (ref 11.5–15.5)
WBC: 7.1 10*3/uL (ref 4.0–10.5)
WBC: 7.3 10*3/uL (ref 4.0–10.5)
nRBC: 0 % (ref 0.0–0.2)
nRBC: 0 % (ref 0.0–0.2)

## 2019-07-28 LAB — HEPATITIS PANEL, ACUTE
HCV Ab: REACTIVE — AB
Hep A IgM: NONREACTIVE
Hep B C IgM: NONREACTIVE
Hepatitis B Surface Ag: NONREACTIVE

## 2019-07-28 LAB — COMPREHENSIVE METABOLIC PANEL
ALT: 57 U/L — ABNORMAL HIGH (ref 0–44)
ALT: 73 U/L — ABNORMAL HIGH (ref 0–44)
AST: 42 U/L — ABNORMAL HIGH (ref 15–41)
AST: 51 U/L — ABNORMAL HIGH (ref 15–41)
Albumin: 3.4 g/dL — ABNORMAL LOW (ref 3.5–5.0)
Albumin: 4.4 g/dL (ref 3.5–5.0)
Alkaline Phosphatase: 66 U/L (ref 38–126)
Alkaline Phosphatase: 83 U/L (ref 38–126)
Anion gap: 11 (ref 5–15)
Anion gap: 9 (ref 5–15)
BUN: 6 mg/dL (ref 6–20)
BUN: 9 mg/dL (ref 6–20)
CO2: 29 mmol/L (ref 22–32)
CO2: 30 mmol/L (ref 22–32)
Calcium: 8.9 mg/dL (ref 8.9–10.3)
Calcium: 9.4 mg/dL (ref 8.9–10.3)
Chloride: 102 mmol/L (ref 98–111)
Chloride: 96 mmol/L — ABNORMAL LOW (ref 98–111)
Creatinine, Ser: 0.83 mg/dL (ref 0.61–1.24)
Creatinine, Ser: 0.85 mg/dL (ref 0.61–1.24)
GFR calc Af Amer: >60 mL/min (ref >60)
GFR calc Af Amer: >60 mL/min (ref >60)
GFR calc non Af Amer: >60 mL/min (ref >60)
GFR calc non Af Amer: >60 mL/min (ref >60)
Glucose, Bld: 103 mg/dL — ABNORMAL HIGH (ref 70–99)
Glucose, Bld: 107 mg/dL — ABNORMAL HIGH (ref 70–99)
Potassium: 4 mmol/L (ref 3.5–5.1)
Potassium: 4.2 mmol/L (ref 3.5–5.1)
Sodium: 137 mmol/L (ref 135–145)
Sodium: 140 mmol/L (ref 135–145)
Total Bilirubin: 0.5 mg/dL (ref 0.3–1.2)
Total Bilirubin: 0.6 mg/dL (ref 0.3–1.2)
Total Protein: 6.5 g/dL (ref 6.5–8.1)
Total Protein: 8.2 g/dL — ABNORMAL HIGH (ref 6.5–8.1)

## 2019-07-28 LAB — URINALYSIS, ROUTINE W REFLEX MICROSCOPIC
Bilirubin Urine: NEGATIVE
Glucose, UA: NEGATIVE mg/dL
Hgb urine dipstick: NEGATIVE
Ketones, ur: NEGATIVE mg/dL
Leukocytes,Ua: NEGATIVE
Nitrite: NEGATIVE
Protein, ur: NEGATIVE mg/dL
Specific Gravity, Urine: 1.016 (ref 1.005–1.030)
pH: 6 (ref 5.0–8.0)

## 2019-07-28 LAB — LACTIC ACID, PLASMA
Lactic Acid, Venous: 1 mmol/L (ref 0.5–1.9)
Lactic Acid, Venous: 1 mmol/L (ref 0.5–1.9)

## 2019-07-28 LAB — SARS CORONAVIRUS 2 BY RT PCR (HOSPITAL ORDER, PERFORMED IN ~~LOC~~ HOSPITAL LAB): SARS Coronavirus 2: NEGATIVE

## 2019-07-28 LAB — SALICYLATE LEVEL: Salicylate Lvl: <7 mg/dL (ref 2.8–30.0)

## 2019-07-28 LAB — HIV ANTIBODY (ROUTINE TESTING W REFLEX): HIV Screen 4th Generation wRfx: NONREACTIVE

## 2019-07-28 LAB — ACETAMINOPHEN LEVEL: Acetaminophen (Tylenol), Serum: <10 ug/mL — ABNORMAL LOW (ref 10–30)

## 2019-07-28 LAB — ETHANOL: Alcohol, Ethyl (B): <10 mg/dL (ref <10)

## 2019-07-28 MED ORDER — SODIUM CHLORIDE 0.9 % IV SOLN
2.0000 g | Freq: Three times a day (TID) | INTRAVENOUS | Status: DC
  Administered 2019-07-28 – 2019-07-29 (×4): 2 g via INTRAVENOUS
  Filled 2019-07-28 (×5): qty 2

## 2019-07-28 MED ORDER — SODIUM CHLORIDE 0.9 % IV SOLN
2.0000 g | Freq: Once | INTRAVENOUS | Status: AC
  Administered 2019-07-28: 2 g via INTRAVENOUS
  Filled 2019-07-28 (×2): qty 2

## 2019-07-28 MED ORDER — VANCOMYCIN HCL 10 G IV SOLR
1250.0000 mg | Freq: Two times a day (BID) | INTRAVENOUS | Status: DC
  Administered 2019-07-28 – 2019-07-29 (×2): 1250 mg via INTRAVENOUS
  Filled 2019-07-28 (×3): qty 1250

## 2019-07-28 MED ORDER — TRAZODONE HCL 50 MG PO TABS
50.0000 mg | ORAL_TABLET | Freq: Once | ORAL | Status: AC
  Administered 2019-07-28: 50 mg via ORAL
  Filled 2019-07-28: qty 1

## 2019-07-28 MED ORDER — ENOXAPARIN SODIUM 40 MG/0.4ML ~~LOC~~ SOLN
40.0000 mg | SUBCUTANEOUS | Status: DC
  Administered 2019-07-29: 40 mg via SUBCUTANEOUS
  Filled 2019-07-28: qty 0.4

## 2019-07-28 MED ORDER — IBUPROFEN 800 MG PO TABS
800.0000 mg | ORAL_TABLET | Freq: Once | ORAL | Status: AC
  Administered 2019-07-28: 800 mg via ORAL
  Filled 2019-07-28: qty 1

## 2019-07-28 MED ORDER — ENOXAPARIN SODIUM 40 MG/0.4ML ~~LOC~~ SOLN: 40.0000 mg | SUBCUTANEOUS | Status: DC

## 2019-07-28 MED ORDER — VANCOMYCIN HCL 10 G IV SOLR
1500.0000 mg | Freq: Once | INTRAVENOUS | Status: AC
  Administered 2019-07-28: 1500 mg via INTRAVENOUS
  Filled 2019-07-28: qty 1500

## 2019-07-28 NOTE — Progress Notes (Signed)
New Admission Note:   Arrival Method: Wheelchair   Mental Orientation: Alert and oriented x4 Telemetry: Not ordered Assessment: Completed Skin: No skin issues, second RN to verify Kerry Dory, RN IV: L Wrist, Flushed and saline locked Pain: Denies pain on admission 0/10 Tubes: None Safety Measures: Safety Fall Prevention Plan has been given, discussed and signed, Patient also has 1:1 sitter at bedside for suicide precautions Admission: Completed [facility] [place] Orientation: Patient has been orientated to the room, unit and staff.  Family: None at bedside.  Orders have been reviewed and implemented. Will continue to monitor the patient. Call light has been placed within reach and bed alarm has been activated.   Nonie Hoyer BSN, RN Phone number: [phone]

## 2019-07-28 NOTE — ED Provider Notes (Signed)
28 year old male received at signout from [facility] pending further clinical evaluation as the patient developed a fever while in the ER tonight.  "Caleb Ellis is a 28 y.o. male.  Who presents emergency department with chief complaint of suicidal ideation.  He is a past medical history of polysubstance abuse including methamphetamine and heroin but states he uses "anything I can get my hands on."  Patient states that he feels like his life is just going downhill.  He got into an argument with his girlfriend tonight who broke up with him.  Patient is very tearful which is likely the reason his heart rate is elevated.  He denies ingestion of any substances or overdose.  He states that he has been thinking about hurting himself all week.  He states that he wants to "cut myself" he states "anything I can do to get myself out of this pain and end it."  He denies any homicidal ideation, audiovisual hallucinations.  He has a previous history of major depressive disorder, substance induced mood disorder and previous psychiatric hospitalizations.   The history is provided by the patient."   On my evaluation, the patient reports that he has been feeling feverish for the last few days.  He has not measured his temperature at home.  He denies chest pain, shortness of breath, back pain, genitourinary complaints, neck pain, headache, or URI symptoms.  He reports that he last used heroin earlier tonight.  He has not used meth in over a week.  He denies any other recreational or illicit substance use.   Physical Exam  BP 131/86 (BP Location: Left Arm)   Pulse (!) 103   Temp 98.4 F (36.9 C) (Oral)   Resp 18   Ht 5\' 8"  (1.727 m)   Wt 63.5 kg   SpO2 100%   BMI 21.29 kg/m   Physical Exam Vitals signs and nursing note reviewed.  Constitutional:      Appearance: He is well-developed. He is diaphoretic.  HENT:     Head: Normocephalic.  Eyes:     Conjunctiva/sclera: Conjunctivae normal.  Neck:   Musculoskeletal: Neck supple.  Cardiovascular:     Rate and Rhythm: Regular rhythm. Tachycardia present.     Pulses: Normal pulses.     Heart sounds: Normal heart sounds. No murmur. No friction rub. No gallop.   Pulmonary:     Effort: Pulmonary effort is normal. No respiratory distress.     Breath sounds: No stridor. No wheezing, rhonchi or rales.     Comments: No audible murmurs, rubs, or gallops. Chest:     Chest wall: No tenderness.  Abdominal:     General: There is no distension.     Palpations: Abdomen is soft. There is no mass.     Tenderness: There is no abdominal tenderness. There is no right CVA tenderness, left CVA tenderness, guarding or rebound.     Hernia: No hernia is present.     Comments: Abdomen is soft and nontender.  Musculoskeletal:     Right lower leg: No edema.     Left lower leg: No edema.     Comments: Track marks noted to the bilateral AC.  No tenderness to the cervical, thoracic, or lumbar spinous processes or bilateral paraspinal muscles.  Skin:    General: Skin is warm.     Comments: No rashes, Janeway lesions, or Osler nodes.  Neurological:     Mental Status: He is alert.  Psychiatric:  Behavior: Behavior normal.     ED Course/Procedures     Procedures  MDM    28 year old male received at signout from [facility] pending further clinical evaluation.  The patient presented for SI after a break-up with his significant other earlier today.  He was initially tachycardic in the 120s on arrival to the ER, but this was initially thought to be secondary to anxiety.  On reevaluation of his vital signs, he was febrile to 100.9 and still remained minimally tachycardic.  The patient uses IV methamphetamine and heroin.  Last heroin use was earlier tonight, but he has not used meth for greater than 1 week.  He does also endorse he has been having subjective fevers for the last few days.  No leukocytosis.  He does have mildly elevated transaminases, but  has no abdominal tenderness.  He has no urinary complaints or URI complaints.  No physical exam findings concerning for endocarditis.  He has no back pain concerning for epidural abscess, myositis.  COVID-19 testing is negative.  Given fever, could consider bacteremia as the source.  Will add on urinalysis and chest x-ray for complete evaluation.  The patient was accepted for admission at behavioral health, but cannot be medically cleared due to febrile illness.  Blood cultures x2 have been ordered.  Will empirically start the patient on vancomycin and cefepime and consult the hospitalist team for admission for febrile illness and IV drug user. Dr. Thomes Dinning has accepted the patient for admission.       Barkley Boards, PA-C 07/28/19 1610    Nira Conn, MD 07/28/19 0700

## 2019-07-28 NOTE — Progress Notes (Signed)
[facility]  PROGRESS NOTE  Eliga Arvie HLK:[id number] DOB: 24-May-1991 DOA: 07/27/2019 PCP: Patient, No Pcp Per  Brief History    HPI: Caleb Ellis is a 28 y.o. male with medical history significant for IVDA (methamphetamine and heroine), bipolar disorder and tobacco abuse who presented on 07/27/2019 to the emergency department due to suicidal ideation with plan.  He complained of worsening depressive moods since past week with thoughts of cutting his wrists or overdosing on heroin.  Patient had an argument with his girlfriend tonight and they were kicked out of their hotel room and she broke up with him.  He presents to the emergency department due to suicidal ideation.  He denies chest pain, shortness of breath, headache, back pain, nausea, vomiting or abdominal pain.  ED Course: ,T-max 100.9 w heart rate range 75-1 21, normal oxygen saturation, blood pressure range 97/63-131/86.  Lab work notable for lactic acid within normal limits, UA negative, glucose 107, AST 51, ALT 73 unremarkable levels of ethanol, salicylate and acetaminophen level.  WBC 7.3.  Covid test negative UDS positive for amphetamines. Chest x-ray with no acute abnormalities. Given history of IV drug use and fever blood cultures x2 were obtained and patient was started empirically on vancomycin, cefepime.  [facility] was called for admission and further management.  A & P     Febrile illness in a patient with history of IV drug use, most concerning for potential bacteremia/endocarditis.  No infectious source identified so far, no active sepsis, hemodynamically stable.  Continue empiric antibiotics given high concern for endocarditis/bacteremia based off presentation.  If cultures come back positive will obtain TTE, monitor CBC   Transaminitis.  AST 51, ALT 73 alk phos and total bilirubin unremarkable doubt biliary involvement, patient without abdominal pain.  Antibody positive, check HCV RNA, increased risk  related to active IV drug use   Bipolar disorder/depression with active suicidal ideation with plan.  Behavioral health assessment by counselor on admission.  Patient meets criteria for inpatient psychiatric treatment, once medically cleared     DVT prophylaxis: Lovenox Code Status: Full code Family Communication: No family at bedside Disposition Plan: Monitor for fevers, monitor blood cultures     [facility] Direct contact: see [email or web address] (further directions at bottom of note if needed) 7PM-7AM contact night coverage as at bottom of note 07/28/2019, 8:07 AM  LOS: 0 days   Consultants  . None  Procedures  . None  Antibiotics  . IV vancomycin, cefepime  Interval History/Subjective  Admits to active IV heroin use, usually injects in arm but no other sites Denies any new lesions, redness or skin changes Reports having weakness and subjective fevers a few days before coming to hospital Otherwise denies any cough, dysuria, diarrhea, chest pain/palpitations  Admits prior history of tooth infection several years ago, otherwise has never had infections related to IV drug use  Objective   Vitals:  Vitals:   07/28/19 0251 07/28/19 0600  BP:  97/63  Pulse:  75  Resp:  18  Temp: 98.4 F (36.9 C) 98.6 F (37 C)  SpO2:  100%    Exam:  Awake Alert, Oriented X 3, No new F.N deficits, Normal affect Port intention Symmetrical Chest wall movement, Good air movement bilaterally, CTAB RRR,No Gallops,Rubs or new Murmurs, No Parasternal Heave +ve B.Sounds, Abd Soft, No tenderness,, No rebound - guarding or rigidity. No rashes, no bruising   I have personally reviewed the following:   Data Reviewed: Basic Metabolic Panel: Recent Labs  Lab  07/28/19 0007  NA 137  K 4.2  CL 96*  CO2 30  GLUCOSE 107*  BUN 6  CREATININE 0.85  CALCIUM 9.4   Liver Function Tests: Recent Labs  Lab 07/28/19 0007  AST 51*  ALT 73*  ALKPHOS 83  BILITOT 0.6  PROT 8.2*  ALBUMIN  4.4   No results for input(s): LIPASE, AMYLASE in the last 168 hours. No results for input(s): AMMONIA in the last 168 hours. CBC: Recent Labs  Lab 07/28/19 0007  WBC 7.3  HGB 15.6  HCT 48.0  MCV 91.4  PLT 216   Cardiac Enzymes: No results for input(s): CKTOTAL, CKMB, CKMBINDEX, TROPONINI in the last 168 hours. BNP (last 3 results) No results for input(s): BNP in the last 8760 hours.  ProBNP (last 3 results) No results for input(s): PROBNP in the last 8760 hours.  CBG: No results for input(s): GLUCAP in the last 168 hours.  Recent Results (from the past 240 hour(s))  SARS Coronavirus 2 by RT PCR (hospital order, performed in [facility] hospital lab) Nasopharyngeal Nasopharyngeal Swab     Status: None   Collection Time: 07/28/19 12:01 AM   Specimen: Nasopharyngeal Swab  Result Value Ref Range Status   SARS Coronavirus 2 NEGATIVE NEGATIVE Final    Comment: (NOTE) If result is NEGATIVE SARS-CoV-2 target nucleic acids are NOT DETECTED. The SARS-CoV-2 RNA is generally detectable in upper and lower  respiratory specimens during the acute phase of infection. The lowest  concentration of SARS-CoV-2 viral copies this assay can detect is 250  copies / mL. A negative result does not preclude SARS-CoV-2 infection  and should not be used as the sole basis for treatment or other  patient management decisions.  A negative result may occur with  improper specimen collection / handling, submission of specimen other  than nasopharyngeal swab, presence of viral mutation(s) within the  areas targeted by this assay, and inadequate number of viral copies  (<250 copies / mL). A negative result must be combined with clinical  observations, patient history, and epidemiological information. If result is POSITIVE SARS-CoV-2 target nucleic acids are DETECTED. The SARS-CoV-2 RNA is generally detectable in upper and lower  respiratory specimens dur ing the acute phase of infection.  Positive   results are indicative of active infection with SARS-CoV-2.  Clinical  correlation with patient history and other diagnostic information is  necessary to determine patient infection status.  Positive results do  not rule out bacterial infection or co-infection with other viruses. If result is PRESUMPTIVE POSTIVE SARS-CoV-2 nucleic acids MAY BE PRESENT.   A presumptive positive result was obtained on the submitted specimen  and confirmed on repeat testing.  While 2019 novel coronavirus  (SARS-CoV-2) nucleic acids may be present in the submitted sample  additional confirmatory testing may be necessary for epidemiological  and / or clinical management purposes  to differentiate between  SARS-CoV-2 and other Sarbecovirus currently known to infect humans.  If clinically indicated additional testing with an alternate test  methodology [phone]) is advised. The SARS-CoV-2 RNA is generally  detectable in upper and lower respiratory sp ecimens during the acute  phase of infection. The expected result is Negative. Fact Sheet for Patients:  StrictlyIdeas.no Fact Sheet for Healthcare Providers: BankingDealers.co.za This test is not yet approved or cleared by the [place] FDA and has been authorized for detection and/or diagnosis of SARS-CoV-2 by FDA under an Emergency Use Authorization (EUA).  This EUA will remain in effect (meaning this test  can be used) for the duration of the COVID-19 declaration under Section 564(b)(1) of the Act, 21 U.S.C. section 360bbb-3(b)(1), unless the authorization is terminated or revoked sooner. Performed at [facility], [place] [address] [place], [place] [address]      Studies: Dg Chest 2 View  Result Date: 07/28/2019 CLINICAL DATA:  Fever, smoker EXAM: CHEST - 2 VIEW COMPARISON:  May 15, 2019 FINDINGS: The heart size and mediastinal contours are within normal limits. Both lungs are clear.  The visualized skeletal structures are unremarkable. IMPRESSION: No acute cardiopulmonary process. Electronically Signed   By: Prudencio Pair M.D.   On: 07/28/2019 03:18    Scheduled Meds: . enoxaparin (LOVENOX) injection  40 mg Subcutaneous Q24H  . nicotine  21 mg Transdermal Daily   Continuous Infusions: . ceFEPime (MAXIPIME) IV    . vancomycin      Active Problems:   Acute febrile illness      Desiree Hane  [facility]

## 2019-07-28 NOTE — Progress Notes (Signed)
A consult was received from an ED physician for cefepime and vancomycin per pharmacy dosing.  The patient's profile has been reviewed for ht/wt/allergies/indication/available labs.   A one time order has been placed for Cefepime 2 gm and Vancomycin 1500 mg.  Further antibiotics/pharmacy consults should be ordered by admitting physician if indicated.                       Thank you, Dorrene German 07/28/2019  3:15 AM
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
## 2019-07-28 NOTE — Progress Notes (Signed)
Pt became tearful when visitor, girlfriend, was asked to leave at the end of visiting hours. He states they are homeless and the only reason he came to the hospital stating he was suicidal was to get a place to stay. He asked why he was being kept here. I explained the MD concerns and the importance of treatment as well as the inpatient [place]. Informed pt that we would have to IVC him if he attempted to leave. AC aware. Will monitor.

## 2019-07-28 NOTE — Progress Notes (Signed)
Pharmacy Antibiotic Note  Caleb Ellis is a 28 y.o. male with hx of IVDU admitted on 07/27/2019 with bacteremia.  Pharmacy has been consulted for vancomycin dosing.  Plan: Vancomycin 1500 mg x1 then 1250 mg IV q12h for est AUC = 509 Adjust Cefepime to 2 Gm IV q8h F/u scr/cultures/levels  Height: 5\' 8"  (172.7 cm) Weight: 140 lb (63.5 kg) IBW/kg (Calculated) : 68.4  Temp (24hrs), Avg:99.3 F (37.4 C), Min:98.4 F (36.9 C), Max:100.9 F (38.3 C)  Recent Labs  Lab 07/28/19 0007 07/28/19 0315  WBC 7.3  --   CREATININE 0.85  --   LATICACIDVEN  --  1.0    Estimated Creatinine Clearance: 115.2 mL/min (by C-G formula based on SCr of 0.85 mg/dL).    No Known Allergies  Antimicrobials this admission: 10/18 cefepime >>  10/18 vancomycin >>   Dose adjustments this admission:   Microbiology results:  BCx:   UCx:    Sputum:    MRSA PCR:   Thank you for allowing pharmacy to be a part of this patient's care.  Dorrene German 07/28/2019 4:28 AM

## 2019-07-28 NOTE — ED Notes (Signed)
ED TO INPATIENT HANDOFF REPORT  ED Nurse Name and Phone #:  Wilfredo Canterbury [phone]  S Name/Age/Gender Caleb Ellis 28 y.o. male Room/Bed: WA31/WA31  Code Status   Code Status: Full Code  Home/SNF/Other Home Patient oriented to: self, place, time and situation Is this baseline? Yes   Triage Complete: Triage complete  Chief Complaint suicidal   Triage Note Pt reports having suicidal ideation for the last few hrs with plan of cutting wrists. Pt reports breaking up with girlfriend tonight.    Allergies No Known Allergies  Level of Care/Admitting Diagnosis ED Disposition    ED Disposition Condition Comment   Admit  Hospital Area: [place] [[id number]]  Level of Care: Med-Surg [16]  Covid Evaluation: Confirmed COVID Negative  Diagnosis: Acute febrile illness [671245]  Admitting Physician: Bernadette Hoit [[id number]]  Attending Physician: Bernadette Hoit [[id number]]  PT Class (Do Not Modify): Observation [104]  PT Acc Code (Do Not Modify): Observation [10022]       B Medical/Surgery History Past Medical History:  Diagnosis Date  . ADHD (attention deficit hyperactivity disorder)   . Bipolar disorder ([place])   . Depression   . Drug abuse ([place])   . Outbursts of anger    History reviewed. No pertinent surgical history.   A IV Location/Drains/Wounds Patient Lines/Drains/Airways Status   Active Line/Drains/Airways    Name:   Placement date:   Placement time:   Site:   Days:   Peripheral IV 07/28/19 Left Forearm   07/28/19    0320    Forearm   less than 1          Intake/Output Last 24 hours  Intake/Output Summary (Last 24 hours) at 07/28/2019 7673 Last data filed at 07/28/2019 0630 Gross per 24 hour  Intake 460 ml  Output 2 ml  Net 458 ml    Labs/Imaging Results for orders placed or performed during the hospital encounter of 07/27/19 (from the past 48 hour(s))  Rapid urine drug screen (hospital performed)     Status: Abnormal   Collection  Time: 07/27/19 10:47 PM  Result Value Ref Range   Opiates NONE DETECTED NONE DETECTED   Cocaine NONE DETECTED NONE DETECTED   Benzodiazepines NONE DETECTED NONE DETECTED   Amphetamines POSITIVE (A) NONE DETECTED   Tetrahydrocannabinol NONE DETECTED NONE DETECTED   Barbiturates NONE DETECTED NONE DETECTED    Comment: (NOTE) DRUG SCREEN FOR MEDICAL PURPOSES ONLY.  IF CONFIRMATION IS NEEDED FOR ANY PURPOSE, NOTIFY LAB WITHIN 5 DAYS. LOWEST DETECTABLE LIMITS FOR URINE DRUG SCREEN Drug Class                     Cutoff (ng/mL) Amphetamine and metabolites    1000 Barbiturate and metabolites    200 Benzodiazepine                 419 Tricyclics and metabolites     300 Opiates and metabolites        300 Cocaine and metabolites        300 THC                            50 Performed at [facility], [place] [address], [place], [place] [address]   SARS Coronavirus 2 by RT PCR (hospital order, performed in [facility] hospital lab) Nasopharyngeal Nasopharyngeal Swab     Status: None   Collection Time: 07/28/19 12:01 AM   Specimen: Nasopharyngeal Swab  Result Value Ref Range   SARS Coronavirus 2 NEGATIVE NEGATIVE    Comment: (NOTE) If result is NEGATIVE SARS-CoV-2 target nucleic acids are NOT DETECTED. The SARS-CoV-2 RNA is generally detectable in upper and lower  respiratory specimens during the acute phase of infection. The lowest  concentration of SARS-CoV-2 viral copies this assay can detect is 250  copies / mL. A negative result does not preclude SARS-CoV-2 infection  and should not be used as the sole basis for treatment or other  patient management decisions.  A negative result may occur with  improper specimen collection / handling, submission of specimen other  than nasopharyngeal swab, presence of viral mutation(s) within the  areas targeted by this assay, and inadequate number of viral copies  (<250 copies / mL). A negative result must be combined with clinical   observations, patient history, and epidemiological information. If result is POSITIVE SARS-CoV-2 target nucleic acids are DETECTED. The SARS-CoV-2 RNA is generally detectable in upper and lower  respiratory specimens dur ing the acute phase of infection.  Positive  results are indicative of active infection with SARS-CoV-2.  Clinical  correlation with patient history and other diagnostic information is  necessary to determine patient infection status.  Positive results do  not rule out bacterial infection or co-infection with other viruses. If result is PRESUMPTIVE POSTIVE SARS-CoV-2 nucleic acids MAY BE PRESENT.   A presumptive positive result was obtained on the submitted specimen  and confirmed on repeat testing.  While 2019 novel coronavirus  (SARS-CoV-2) nucleic acids may be present in the submitted sample  additional confirmatory testing may be necessary for epidemiological  and / or clinical management purposes  to differentiate between  SARS-CoV-2 and other Sarbecovirus currently known to infect humans.  If clinically indicated additional testing with an alternate test  methodology [phone]) is advised. The SARS-CoV-2 RNA is generally  detectable in upper and lower respiratory sp ecimens during the acute  phase of infection. The expected result is Negative. Fact Sheet for Patients:  BoilerBrush.com.cy Fact Sheet for Healthcare Providers: https://pope.com/ This test is not yet approved or cleared by the [place] FDA and has been authorized for detection and/or diagnosis of SARS-CoV-2 by FDA under an Emergency Use Authorization (EUA).  This EUA will remain in effect (meaning this test can be used) for the duration of the COVID-19 declaration under Section 564(b)(1) of the Act, 21 U.S.C. section 360bbb-3(b)(1), unless the authorization is terminated or revoked sooner. Performed at [facility], [address], [place], [place] [address]   Comprehensive metabolic panel     Status: Abnormal   Collection Time: 07/28/19 12:07 AM  Result Value Ref Range   Sodium 137 135 - 145 mmol/L   Potassium 4.2 3.5 - 5.1 mmol/L   Chloride 96 (L) 98 - 111 mmol/L   CO2 30 22 - 32 mmol/L   Glucose, Bld 107 (H) 70 - 99 mg/dL   BUN 6 6 - 20 mg/dL   Creatinine, Ser 8.11 0.61 - 1.24 mg/dL   Calcium 9.4 8.9 - 91.4 mg/dL   Total Protein 8.2 (H) 6.5 - 8.1 g/dL   Albumin 4.4 3.5 - 5.0 g/dL   AST 51 (H) 15 - 41 U/L   ALT 73 (H) 0 - 44 U/L   Alkaline Phosphatase 83 38 - 126 U/L   Total Bilirubin 0.6 0.3 - 1.2 mg/dL   GFR calc non Af Amer >60 >60 mL/min   GFR calc Af Amer >60 >60 mL/min  Anion gap 11 5 - 15    Comment: Performed at [facility], [address], [place], [place] [address]  Ethanol     Status: None   Collection Time: 07/28/19 12:07 AM  Result Value Ref Range   Alcohol, Ethyl (B) <10 <10 mg/dL    Comment: (NOTE) Lowest detectable limit for serum alcohol is 10 mg/dL. For medical purposes only. Performed at [facility], [address], [place], [place] [address]   Salicylate level     Status: None   Collection Time: 07/28/19 12:07 AM  Result Value Ref Range   Salicylate Lvl <7.0 2.8 - 30.0 mg/dL    Comment: Performed at [facility], [address], [place], [place] [address]  Acetaminophen level     Status: Abnormal   Collection Time: 07/28/19 12:07 AM  Result Value Ref Range   Acetaminophen (Tylenol), Serum <10 (L) 10 - 30 ug/mL    Comment: (NOTE) Therapeutic concentrations vary significantly. A range of 10-30 ug/mL  may be an effective concentration for many patients. However, some  are best treated at concentrations outside of this range. Acetaminophen concentrations >150 ug/mL at 4 hours after ingestion  and >50 ug/mL at 12 hours after ingestion are often associated with  toxic reactions. Performed at [facility], [address], [place], [place] [address]   cbc     Status: None   Collection Time: 07/28/19 12:07 AM  Result Value Ref Range   WBC 7.3 4.0 - 10.5 K/uL   RBC 5.25 4.22 - 5.81 MIL/uL   Hemoglobin 15.6 13.0 - 17.0 g/dL   HCT 55.7 32.2 - 02.5 %   MCV 91.4 80.0 - 100.0 fL   MCH 29.7 26.0 - 34.0 pg   MCHC 32.5 30.0 - 36.0 g/dL   RDW 42.7 06.2 - 37.6 %   Platelets 216 150 - 400 K/uL   nRBC 0.0 0.0 - 0.2 %    Comment: Performed at [facility], [address] [place], [place] [address]  Lactic acid, plasma     Status: None   Collection Time: 07/28/19  3:15 AM  Result Value Ref Range   Lactic Acid, Venous 1.0 0.5 - 1.9 mmol/L    Comment: Performed at [facility], [address], [place], [place] [address]  Urinalysis, Routine w reflex microscopic     Status: None   Collection Time: 07/28/19  3:15 AM  Result Value Ref Range   Color, Urine YELLOW YELLOW   APPearance CLEAR CLEAR   Specific Gravity, Urine 1.016 1.005 - 1.030   pH 6.0 5.0 - 8.0   Glucose, UA NEGATIVE NEGATIVE mg/dL   Hgb urine dipstick NEGATIVE NEGATIVE   Bilirubin Urine NEGATIVE NEGATIVE   Ketones, ur NEGATIVE NEGATIVE mg/dL   Protein, ur NEGATIVE NEGATIVE mg/dL   Nitrite NEGATIVE NEGATIVE   Leukocytes,Ua NEGATIVE NEGATIVE    Comment: Performed at [facility], [address] [place], [place] [address]   Dg Chest 2 View  Result Date: 07/28/2019 CLINICAL DATA:  Fever, smoker EXAM: CHEST - 2 VIEW COMPARISON:  May 15, 2019 FINDINGS: The heart size and mediastinal contours are within normal limits. Both lungs are clear. The visualized skeletal structures are unremarkable. IMPRESSION: No acute cardiopulmonary process. Electronically Signed   By: Jonna Clark M.D.   On: 07/28/2019 03:18    Pending Labs [employer] (From admission, onward)    Start  Ordered   08/04/19 0500  Creatinine, serum  (enoxaparin (LOVENOX)    CrCl >/= 30  ml/min)  Weekly,   R    Comments: while on enoxaparin therapy    07/28/19 0358   07/28/19 0830  Hepatitis panel, acute  Once,   STAT     07/28/19 0829   07/28/19 0500  Comprehensive metabolic panel  Tomorrow morning,   R     07/28/19 0358   07/28/19 0500  CBC  Tomorrow morning,   R     07/28/19 0358   07/28/19 0356  HIV Antibody (routine testing w rflx)  (HIV Antibody (Routine testing w reflex) panel)  Once,   STAT     07/28/19 0358   07/28/19 0356  HIV4GL Save Tube  (HIV Antibody (Routine testing w reflex) panel)  Once,   STAT     07/28/19 0358   07/28/19 0356  CBC  (enoxaparin (LOVENOX)    CrCl >/= 30 ml/min)  Once,   STAT    Comments: Baseline for enoxaparin therapy IF NOT ALREADY DRAWN.  Notify MD if PLT < 100 K.    07/28/19 0358   07/28/19 0356  Creatinine, serum  (enoxaparin (LOVENOX)    CrCl >/= 30 ml/min)  Once,   STAT    Comments: Baseline for enoxaparin therapy IF NOT ALREADY DRAWN.    07/28/19 0358   07/28/19 0301  Lactic acid, plasma  Now then every 2 hours,   STAT     07/28/19 0304   07/28/19 0300  Blood culture (routine x 2)  BLOOD CULTURE X 2,   STAT     07/28/19 0259          Vitals/Pain Today's Vitals   07/27/19 2245 07/27/19 2342 07/28/19 0251 07/28/19 0600  BP:  131/86  97/63  Pulse:  (!) 103  75  Resp:  18  18  Temp:  (!) 100.9 F (38.3 C) 98.4 F (36.9 C) 98.6 F (37 C)  TempSrc:  Oral Oral Oral  SpO2:  100%  100%  Weight: 63.5 kg     Height: 5\' 8"  (1.727 m)     PainSc:        Isolation Precautions No active isolations  Medications Medications  acetaminophen (TYLENOL) tablet 650 mg (650 mg Oral Not Given 07/28/19 0001)  ondansetron (ZOFRAN) tablet 4 mg (has no administration in time range)  alum & mag hydroxide-simeth (MAALOX/MYLANTA) 200-200-20 MG/5ML suspension 30 mL (has no administration in time range)  nicotine (NICODERM CQ - dosed in mg/24 hours) patch 21 mg (21 mg Transdermal Patch Applied 07/28/19 0003)  enoxaparin (LOVENOX)  injection 40 mg (40 mg Subcutaneous Not Given 07/28/19 0653)  ceFEPIme (MAXIPIME) 2 g in sodium chloride 0.9 % 100 mL IVPB (has no administration in time range)  vancomycin (VANCOCIN) 1,250 mg in sodium chloride 0.9 % 250 mL IVPB (has no administration in time range)  ibuprofen (ADVIL) tablet 800 mg (800 mg Oral Given 07/28/19 0005)  ceFEPIme (MAXIPIME) 2 g in sodium chloride 0.9 % 100 mL IVPB (0 g Intravenous Stopped 07/28/19 0455)  vancomycin (VANCOCIN) 1,500 mg in sodium chloride 0.9 % 500 mL IVPB (0 mg Intravenous Stopped 07/28/19 0717)    Mobility walks Low fall risk   Focused Assessments    R Recommendations: See Admitting Provider Note  Report given to:   Additional Notes:

## 2019-07-28 NOTE — H&P (Signed)
History and Physical  Caleb Ellis XBM:[id number] DOB: 1991-07-27 DOA: 07/27/2019  Referring physician: Delos Haring PCP: Patient, No Pcp Per  Patient coming from: Home  Chief Complaint: Suicidal thoughts and fever  HPI: Caleb Ellis is a 28 y.o. male with medical history significant for IVDA (methamphetamine and heroine), bipolar disorder and tobacco abuse who presents to the emergency department due to suicidal ideation with plan.  He complained of worsening depressive moods since past week with thoughts of cutting his wrists or overdosing on heroin.  Patient had an argument with his girlfriend tonight and they were kicked out of their hotel room and she broke up with him.  He presents to the emergency department due to suicidal ideation.  He denies chest pain, shortness of breath, headache, back pain, nausea, vomiting or abdominal pain.  ED Course: In the emergency department, he was noted to be febrile with a temperature 100.9bpm and tachycardic.  Work-up in the ED showed normal CBC and BMP except for elevated transaminitis and toxicology was positive for amphetamine.  Chest x-ray showed no acute cardiopulmonary process.  Patient was empirically started on IV antibiotics and it was decided for him to be admitted to rule out bacteremia secondary to IV drug abuse.  Hospitalist was asked to admit patient for further evaluation and management.  Review of Systems: Review of Systems  Constitutional: Negative for chills and fever.  HENT: Negative for ear pain and sore throat.   Eyes: Negative for pain and visual disturbance.  Respiratory: Negative for cough, chest tightness and shortness of breath.   Cardiovascular: Negative for chest pain and palpitations.  Gastrointestinal: Negative for abdominal pain and vomiting.  Endocrine: Negative for polyphagia and polyuria.  Genitourinary: Negative for decreased urine volume, dysuria Musculoskeletal: Negative for arthralgias and back pain.   Skin: Negative for color change and rash.  Allergic/Immunologic: Negative for immunocompromised state.  Neurological: Negative for tremors, syncope, speech difficulty, weakness, light-headedness and headaches.  Psychological: Suicidal thoughts with plan to cut his wrist Hematological: Does not bruise/bleed easily.  All other systems reviewed and are negative   Past Medical History:  Diagnosis Date  . ADHD (attention deficit hyperactivity disorder)   . Bipolar disorder ([facility])   . Depression   . Drug abuse ([facility])   . Outbursts of anger    History reviewed. No pertinent surgical history.  Social History:  reports that he has been smoking cigarettes. He has been smoking about 0.50 packs per day. He has never used smokeless tobacco. He reports current alcohol use. He reports current drug use. Drugs: Marijuana and Methamphetamines.   No Known Allergies  History reviewed. No pertinent family history.    Prior to Admission medications   Medication Sig Start Date End Date Taking? Authorizing Provider  acetaminophen (TYLENOL) 500 MG tablet Take 1,000 mg by mouth every 6 (six) hours as needed for moderate pain.   Yes [provider]    Physical Exam: BP 131/86 (BP Location: Left Arm)   Pulse (!) 103   Temp 98.4 F (36.9 C) (Oral)   Resp 18   Ht 5\' 8"  (1.727 m)   Wt 63.5 kg   SpO2 100%   BMI 21.29 kg/m   . General: 28 y.o. year-old male well developed well nourished in no acute distress.  Alert and oriented x3. 37 HEENT: Normocephalic, atraumatic, PERRLA . NECK: Supple, trachea medial, normal range of motion . Cardiovascular: Tachycardia.  Regular rate and rhythm with no rubs or gallops.  No thyromegaly or JVD  noted.  No lower extremity edema. 2/4 pulses in all 4 extremities. Marland Kitchen Respiratory: Clear to auscultation with no wheezes or rales. Good inspiratory effort. . Abdomen: Soft nontender nondistended with normal bowel sounds x4 quadrants. . Muskuloskeletal: No cyanosis,  clubbing or edema noted bilaterally . Neuro: CN II-XII intact, strength, sensation, reflexes . Skin: No ulcerative lesions noted or rashes . Psychiatry: Judgement and insight appear normal. Mood is appropriate for condition and setting          Labs on Admission:  Basic Metabolic Panel: Recent Labs  Lab 07/28/19 0007  NA 137  K 4.2  CL 96*  CO2 30  GLUCOSE 107*  BUN 6  CREATININE 0.85  CALCIUM 9.4   Liver Function Tests: Recent Labs  Lab 07/28/19 0007  AST 51*  ALT 73*  ALKPHOS 83  BILITOT 0.6  PROT 8.2*  ALBUMIN 4.4   No results for input(s): LIPASE, AMYLASE in the last 168 hours. No results for input(s): AMMONIA in the last 168 hours. CBC: Recent Labs  Lab 07/28/19 0007  WBC 7.3  HGB 15.6  HCT 48.0  MCV 91.4  PLT 216   Cardiac Enzymes: No results for input(s): CKTOTAL, CKMB, CKMBINDEX, TROPONINI in the last 168 hours.  BNP (last 3 results) No results for input(s): BNP in the last 8760 hours.  ProBNP (last 3 results) No results for input(s): PROBNP in the last 8760 hours.  CBG: No results for input(s): GLUCAP in the last 168 hours.  Radiological Exams on Admission: Dg Chest 2 View  Result Date: 07/28/2019 CLINICAL DATA:  Fever, smoker EXAM: CHEST - 2 VIEW COMPARISON:  May 15, 2019 FINDINGS: The heart size and mediastinal contours are within normal limits. Both lungs are clear. The visualized skeletal structures are unremarkable. IMPRESSION: No acute cardiopulmonary process. Electronically Signed   By: Prudencio Pair M.D.   On: 07/28/2019 03:18    EKG: I independently viewed the EKG done and my findings are as followed: EKG was not done   Assessment/Plan Present on Admission: . Acute febrile illness  Active Problems:   Acute febrile illness   Acute febrile illness Patient presents with fever in the setting of IV drug abuse, empiric treatment with IV vancomycin and cefepime was started.  Blood culture obtained. Continue Tylenol PRN for fever  Consider echocardiogram based on blood culture findings EKG will be done  IV drug abuse/polysubstance abuse Patient will need counseling regarding drug abuse when more stable.  Suicidal ideation Patient will be provided with one-to-one sitter Consider psych consult prior to discharge  Tobacco abuse Patient counseled on tobacco abuse cessation and he verbalized understanding of the discussion Patient declines nicotine at this time   DVT prophylaxis: Lovenox  Code Status: Full  Family Communication: None at bedside  Disposition Plan: Patient was kicked out of his hotel room prior to coming to ED, consider social worker consult for disposition if patient will not be able to return to his hotel room.   Consults called: None  Admission status: Observation    Bernadette Hoit MD [facility]  If 7PM-7AM, please contact night-coverage [email or web address]  07/28/2019, 4:00 AM

## 2019-07-29 ENCOUNTER — Observation Stay (HOSPITAL_BASED_OUTPATIENT_CLINIC_OR_DEPARTMENT_OTHER): Payer: Self-pay

## 2019-07-29 DIAGNOSIS — F1994 Other psychoactive substance use, unspecified with psychoactive substance-induced mood disorder: Secondary | ICD-10-CM

## 2019-07-29 DIAGNOSIS — F319 Bipolar disorder, unspecified: Secondary | ICD-10-CM | POA: Diagnosis present

## 2019-07-29 DIAGNOSIS — R7881 Bacteremia: Secondary | ICD-10-CM

## 2019-07-29 DIAGNOSIS — F1990 Other psychoactive substance use, unspecified, uncomplicated: Secondary | ICD-10-CM | POA: Diagnosis present

## 2019-07-29 DIAGNOSIS — R768 Other specified abnormal immunological findings in serum: Secondary | ICD-10-CM | POA: Diagnosis present

## 2019-07-29 DIAGNOSIS — R45851 Suicidal ideations: Secondary | ICD-10-CM

## 2019-07-29 DIAGNOSIS — R7689 Other specified abnormal immunological findings in serum: Secondary | ICD-10-CM | POA: Diagnosis present

## 2019-07-29 LAB — BASIC METABOLIC PANEL
Anion gap: 7 (ref 5–15)
BUN: 16 mg/dL (ref 6–20)
CO2: 26 mmol/L (ref 22–32)
Calcium: 8.9 mg/dL (ref 8.9–10.3)
Chloride: 106 mmol/L (ref 98–111)
Creatinine, Ser: 0.7 mg/dL (ref 0.61–1.24)
GFR calc Af Amer: >60 mL/min (ref >60)
GFR calc non Af Amer: >60 mL/min (ref >60)
Glucose, Bld: 103 mg/dL — ABNORMAL HIGH (ref 70–99)
Potassium: 4.6 mmol/L (ref 3.5–5.1)
Sodium: 139 mmol/L (ref 135–145)

## 2019-07-29 LAB — ECHOCARDIOGRAM COMPLETE
Height: 68 in
Weight: 2240 oz

## 2019-07-29 LAB — HCV RNA QUANT
HCV Quantitative Log: 4.721 log10 IU/mL (ref >1.70)
HCV Quantitative: 52600 IU/mL (ref >50)

## 2019-07-29 MED ORDER — TRAZODONE HCL 50 MG PO TABS
50.0000 mg | ORAL_TABLET | Freq: Every evening | ORAL | Status: DC | PRN
  Administered 2019-07-29: 50 mg via ORAL
  Filled 2019-07-29: qty 1

## 2019-07-29 NOTE — Progress Notes (Signed)
Dr. Lonny Prude was called to have a plan of care conversation with patient. Informed him of the current plan of looking for a bed at [facility].  Was very upset and did not want to talk due to being upset. Sitter still at bedside. Will continue to monitor.

## 2019-07-29 NOTE — Progress Notes (Signed)
[facility]  PROGRESS NOTE  [name] VWU:[id number] DOB: 09-08-1991 DOA: 07/27/2019 PCP: Patient, No Pcp Per  Brief History    HPI: Caleb Ellis is a 28 y.o. male with medical history significant for IVDA (methamphetamine and heroine), bipolar disorder and tobacco abuse who presented on 07/27/2019 to the emergency department due to suicidal ideation with plan.  He complained of worsening depressive moods since past week with thoughts of cutting his wrists or overdosing on heroin.  Patient had an argument with his girlfriend tonight and they were kicked out of their hotel room and she broke up with him.  He presents to the emergency department due to suicidal ideation.  He denies chest pain, shortness of breath, headache, back pain, nausea, vomiting or abdominal pain.  Admits to active IV heroin use, usually injects in arm but no other sites Denies any new lesions, redness or skin changes Reports having weakness and subjective fevers a few days before coming to hospital Otherwise denies any cough, dysuria, diarrhea, chest pain/palpitations  Admits prior history of tooth infection several years ago, otherwise has never had infections related to IV drug use  ED Course: ,T-max 100.9 w heart rate range 75-1 21, normal oxygen saturation, blood pressure range 97/63-131/86.  Lab work notable for lactic acid within normal limits, UA negative, glucose 107, AST 51, ALT 73 unremarkable levels of ethanol, salicylate and acetaminophen level.  WBC 7.3.  Covid test negative UDS positive for amphetamines. Chest x-ray with no acute abnormalities. Given history of IV drug use and fever blood cultures x2 were obtained and patient was started empirically on vancomycin, cefepime.  [facility] was called for admission and further management.  A & P     Febrile illness in a patient with history of IV drug use, endocarditis thought less likely.  Active IV drug user however has remained  afebrile, blood cultures are unremarkable, echo pursued due to risk factor however no vegetations noted less likely infective endocarditis additional physical exam stigmata to suggest.  Additionally no other infectious sources for infection.  Discontinue IV antibiotics   Transaminitis.  AST 51, ALT 73 alk phos and total bilirubin unremarkable doubt biliary involvement, patient without abdominal pain.     Positive HCV antibody. Screened due to transaminitis, pending HCV RNA, increased risk related to active IV drug use   Bipolar disorder/depression with active suicidal ideation with plan.  Behavioral health assessment by counselor on admission.  Patient meets criteria for inpatient psychiatric treatment, patient is medically cleared. S/W consulted     DVT prophylaxis: Lovenox Code Status: Full code Family Communication: No family at bedside Disposition Plan: Medically stable and cleared, met criteria for inpatient psych on admission, social worker consulted     [facility] Direct contact: see [email or web address] (further directions at bottom of note if needed) 7PM-7AM contact night coverage as at bottom of note 07/29/2019, 3:48 PM  LOS: 0 days   Consultants  . None  Procedures  . None  Antibiotics  . IV vancomycin, cefepime Admission to 10/19  Interval History/Subjective    Objective   Vitals:  Vitals:   07/29/19 0606 07/29/19 1422  BP: 108/67 116/78  Pulse: 86 91  Resp: 18 18  Temp: 98.4 F (36.9 C) 98.6 F (37 C)  SpO2: 100% 100%    Exam:  Awake Alert, Oriented X 3, No new F.N deficits, Normal affect Poor dentition Symmetrical Chest wall movement, Good air movement bilaterally, CTAB RRR,No Gallops,Rubs or new Murmurs, No Parasternal Heave +ve B.Sounds,  Abd Soft, No tenderness,, No rebound - guarding or rigidity. No rashes, no bruising   I have personally reviewed the following:   Data Reviewed: Basic Metabolic Panel: Recent Labs  Lab 07/28/19 0007  07/28/19 0900 07/29/19 0837  NA 137 140 139  K 4.2 4.0 4.6  CL 96* 102 106  CO2 _0 GLUCOSE 107* 103* 103*  BUN _1 CREATININE 0.85 0.83 0.70  CALCIUM 9.4 8.9 8.9   Liver Function Tests: Recent Labs  Lab 07/28/19 0007 07/28/19 0900  AST 51* 42*  ALT 73* 57*  ALKPHOS 83 66  BILITOT 0.6 0.5  PROT 8.2* 6.5  ALBUMIN 4.4 3.4*   No results for input(s): LIPASE, AMYLASE in the last 168 hours. No results for input(s): AMMONIA in the last 168 hours. CBC: Recent Labs  Lab 07/28/19 0007 07/28/19 0900  WBC 7.3 7.1  HGB 15.6 15.3  HCT 48.0 46.9  MCV 91.4 91.4  PLT 216 206   Cardiac Enzymes: No results for input(s): CKTOTAL, CKMB, CKMBINDEX, TROPONINI in the last 168 hours. BNP (last 3 results) No results for input(s): BNP in the last 8760 hours.  ProBNP (last 3 results) No results for input(s): PROBNP in the last 8760 hours.  CBG: No results for input(s): GLUCAP in the last 168 hours.  Recent Results (from the past 240 hour(s))  SARS Coronavirus 2 by RT PCR (hospital order, performed in [facility] hospital lab) Nasopharyngeal Nasopharyngeal Swab     Status: None   Collection Time: 07/28/19 12:01 AM   Specimen: Nasopharyngeal Swab  Result Value Ref Range Status   SARS Coronavirus 2 NEGATIVE NEGATIVE Final    Comment: (NOTE) If result is NEGATIVE SARS-CoV-2 target nucleic acids are NOT DETECTED. The SARS-CoV-2 RNA is generally detectable in upper and lower  respiratory specimens during the acute phase of infection. The lowest  concentration of SARS-CoV-2 viral copies this assay can detect is 250  copies / mL. A negative result does not preclude SARS-CoV-2 infection  and should not be used as the sole basis for treatment or other  patient management decisions.  A negative result may occur with  improper specimen collection / handling, submission of specimen other  than nasopharyngeal swab, presence of viral mutation(s) within the  areas targeted by this  assay, and inadequate number of viral copies  (<250 copies / mL). A negative result must be combined with clinical  observations, patient history, and epidemiological information. If result is POSITIVE SARS-CoV-2 target nucleic acids are DETECTED. The SARS-CoV-2 RNA is generally detectable in upper and lower  respiratory specimens dur ing the acute phase of infection.  Positive  results are indicative of active infection with SARS-CoV-2.  Clinical  correlation with patient history and other diagnostic information is  necessary to determine patient infection status.  Positive results do  not rule out bacterial infection or co-infection with other viruses. If result is PRESUMPTIVE POSTIVE SARS-CoV-2 nucleic acids MAY BE PRESENT.   A presumptive positive result was obtained on the submitted specimen  and confirmed on repeat testing.  While 2019 novel coronavirus  (SARS-CoV-2) nucleic acids may be present in the submitted sample  additional confirmatory testing may be necessary for epidemiological  and / or clinical management purposes  to differentiate between  SARS-CoV-2 and other Sarbecovirus currently known to infect humans.  If clinically indicated additional testing with an alternate test  methodology [phone]) is advised. The SARS-CoV-2 RNA is generally  detectable in upper and lower  respiratory sp ecimens during the acute  phase of infection. The expected result is Negative. Fact Sheet for Patients:  StrictlyIdeas.no Fact Sheet for Healthcare Providers: BankingDealers.co.za This test is not yet approved or cleared by the [place] FDA and has been authorized for detection and/or diagnosis of SARS-CoV-2 by FDA under an Emergency Use Authorization (EUA).  This EUA will remain in effect (meaning this test can be used) for the duration of the COVID-19 declaration under Section 564(b)(1) of the Act, 21 U.S.C. section 360bbb-3(b)(1),  unless the authorization is terminated or revoked sooner. Performed at [facility], [place] [address], [place], [place] [address]   Blood culture (routine x 2)     Status: None (Preliminary result)   Collection Time: 07/28/19  3:00 AM   Specimen: BLOOD  Result Value Ref Range Status   Specimen Description   Final    BLOOD LEFT WRIST Performed at [facility] [address] [place], [place] [address]    Special Requests   Final    BOTTLES DRAWN AEROBIC AND ANAEROBIC Blood Culture adequate volume Performed at [facility] [address] [place], [place] [address]    Culture   Final    NO GROWTH 1 DAY Performed at [facility], [place] [address] [place], [place] [address]    Report Status PENDING  Incomplete  Blood culture (routine x 2)     Status: None (Preliminary result)   Collection Time: 07/28/19 11:23 AM   Specimen: BLOOD  Result Value Ref Range Status   Specimen Description   Final    BLOOD LEFT ANTECUBITAL Performed at [facility] [address] [place], [place] [address]    Special Requests   Final    BOTTLES DRAWN AEROBIC ONLY Blood Culture adequate volume Performed at [facility] [address], [place], [place] [address]    Culture   Final    NO GROWTH < 24 HOURS Performed at [facility] [address] [place], [place] [address]    Report Status PENDING  Incomplete     Studies: Dg Chest 2 View  Result Date: 07/28/2019 CLINICAL DATA:  Fever, smoker EXAM: CHEST - 2 VIEW COMPARISON:  May 15, 2019 FINDINGS: The heart size and mediastinal contours are within normal limits. Both lungs are clear. The visualized skeletal structures are unremarkable. IMPRESSION: No acute cardiopulmonary process. Electronically Signed   By: Prudencio Pair M.D.   On: 07/28/2019 03:18    Scheduled Meds: . enoxaparin (LOVENOX) injection  40 mg Subcutaneous Q24H  . nicotine   21 mg Transdermal Daily   Continuous Infusions: . ceFEPime (MAXIPIME) IV 2 g (07/29/19 1203)  . vancomycin 1,250 mg (07/29/19 0538)    Active Problems:   Acute febrile illness      Desiree Hane  [facility]

## 2019-07-29 NOTE — Plan of Care (Signed)
  Problem: Education: Goal: Knowledge of General Education information will improve Description: Including pain rating scale, medication(s)/side effects and non-pharmacologic comfort measures Outcome: Progressing   Problem: Health Behavior/Discharge Planning: Goal: Ability to manage health-related needs will improve Outcome: Progressing   Problem: Clinical Measurements: Goal: Ability to maintain clinical measurements within normal limits will improve Outcome: Progressing Goal: Will remain free from infection Outcome: Progressing Goal: Diagnostic test results will improve Outcome: Progressing Goal: Respiratory complications will improve Outcome: Progressing Goal: Cardiovascular complication will be avoided Outcome: Progressing   Problem: Coping: Goal: Level of anxiety will decrease Outcome: Progressing   Problem: Safety: Goal: Ability to remain free from injury will improve Outcome: Progressing   

## 2019-07-29 NOTE — Progress Notes (Signed)
Patient has been resting quietly all day. Very pleasant and participating in care. However just asked about his girlfriend and has not been to see him today. Visually became upset and asked to call someone on the phone. Did call and ask security to look in the ED for her because this was her last known location. Unable to currently located. Reached out to Primary MD to make rounds and discuss plan of care with patient. He states he wants to leave but when reminded that he was under watch he stated he would stay but "wasn't happy about it". Patient's grandfather did call about clothes and spoke to Network engineer. Patient given information. Patient currently resting in bed, sitter at bedside, alert and oriented X 4, bed in the lowest position. Will continue to monitor.

## 2019-07-29 NOTE — TOC Initial Note (Signed)
Transition of Care [facility]) - Initial/Assessment Note    Patient Details  Name: Caleb Ellis MRN: [id number] Date of Birth: 04/23/1991  Transition of Care [facility]) CM/SW Contact:    Nila Nephew, LCSW Phone Number: [phone] 07/29/2019, 4:59 PM  Clinical Narrative:   Seeking inpatient psychiatric admission for pt based on TTS assessment  07/28/19 during which pt endorsed SI with plan to cut wrists or overdose.  Pt voluntary at this time.  Pt medically stable for behavioral health admission as of today, referred to [facility], will refer to other facilities as well if unable to accept.            Expected Discharge Plan: Psychiatric Hospital Barriers to Discharge: Psych Bed not available   Patient Goals and CMS Choice        Expected Discharge Plan and Services Expected Discharge Plan: [facility] In-house Referral: Clinical Social Work                                            Prior Living Arrangements/Services                       Activities of Daily Living Home Assistive Devices/Equipment: None ADL Screening (condition at time of admission) Patient's cognitive ability adequate to safely complete daily activities?: Yes Is the patient deaf or have difficulty hearing?: No Does the patient have difficulty seeing, even when wearing glasses/contacts?: No Does the patient have difficulty concentrating, remembering, or making decisions?: No Patient able to express need for assistance with ADLs?: Yes Does the patient have difficulty dressing or bathing?: No Independently performs ADLs?: Yes (appropriate for developmental age) Does the patient have difficulty walking or climbing stairs?: No Weakness of Legs: None Weakness of Arms/Hands: None  Permission Sought/Granted                  Emotional Assessment           Psych Involvement: Yes (comment)(TTS eval prior to medical floor admission recommended inpatient psychiatric  treatment)  Admission diagnosis:  IVDU (intravenous drug user) [F19.90] Fever, unspecified fever cause [R50.9] Patient Active Problem List   Diagnosis Date Noted  . Hepatitis C antibody positive in blood 07/29/2019  . IVDU (intravenous drug user) 07/29/2019  . Suicidal ideations 07/29/2019  . Bipolar disorder ([place]) 07/29/2019  . Acute febrile illness 07/28/2019  . Substance induced mood disorder ([place]) 10/15/2017  . Severe recurrent major depression without psychotic features ([place]) 10/14/2017   PCP:  Patient, No Pcp Per Pharmacy:   [facility], [place]. [place]. [place] [address] Phone: [phone] Fax: [phone]     Social Determinants of Health (SDOH) Interventions    Readmission Risk Interventions No flowsheet data found.

## 2019-07-29 NOTE — Progress Notes (Signed)
  Echocardiogram 2D Echocardiogram has been performed.  Matilde Bash 07/29/2019, 12:45 PM

## 2019-07-30 DIAGNOSIS — F332 Major depressive disorder, recurrent severe without psychotic features: Secondary | ICD-10-CM

## 2019-07-30 DIAGNOSIS — B1920 Unspecified viral hepatitis C without hepatic coma: Secondary | ICD-10-CM

## 2019-07-30 NOTE — Discharge Summary (Signed)
Caleb Ellis WUJ:[id number] DOB: Jul 18, 1991 DOA: 07/27/2019  PCP: Patient, No Pcp Per  Admit date: 07/27/2019 Discharge date: 07/30/2019  Admitted From: Home Disposition: Behavioral health, inpatient psych  Recommendations for Outpatient Follow-up:  1. Follow up with PCP in 1-2 weeks. 2. Arrange outpatient referral to community health 1's clinic with ID follow-up for hepatitis C   Home Health: No Equipment/Devices: None Discharge Condition: Stable CODE STATUS: Full Diet recommendation: Heart Healthy   Brief/Interim Summary: History of present illness:  Caleb Ellis a 28 y.o.malewith medical history significant forIVDA(methamphetamine and heroine),bipolar disorder and tobacco abuse who presented on 07/27/2019 to the emergency department due to suicidal ideation with plan. He complained of worsening depressive moods since past week with thoughts of cutting his wrists or overdosing on heroin. Patient had an argument with his girlfriend tonight and theywere kicked out of theirhotel room and she broke up with him. He presents to the emergency department due to suicidal ideation. He denies chest pain, shortness of breath, headache, back pain, nausea, vomiting or abdominal pain.  Admits to active IV heroin use, usually injects in arm but no other sites Denies any new lesions, redness or skin changes Reports having weakness and subjective fevers a few days before coming to hospital Otherwise denies any cough, dysuria, diarrhea, chest pain/palpitations  Admits prior history of tooth infection several years ago, otherwise has never had infections related to IV drug use  ED Course: ,T-max 100.9 w heart rate range 75-1 21, normal oxygen saturation, blood pressure range 97/63-131/86.  Lab work notable for lactic acid within normal limits, UA negative, glucose 107, AST 51, ALT 73 unremarkable levels of ethanol, salicylate and acetaminophen level.  WBC 7.3.  Covid test  negative UDS positive for amphetamines. Chest x-ray with no acute abnormalities. Given history of IV drug use and fever blood cultures x2 were obtained and patient was started empirically on vancomycin, cefepime.  [facility] hospital[facility] was called for admission and further management.   Remaining hospital course addressed in problem based format below:   Hospital Course:    Febrile illness in a patient with history of IV drug use, endocarditis ruled out, resolved.  Active IV drug user however has remained afebrile throughout hospital stay with no growth of blood cultures, echo pursued due to risk factor however no vegetations noted.  less likely infective endocarditis and no additional physical exam stigmata to suggest.  Additionally no other infectious sources for infection.  Remained afebrile after empiric antibiotics discontinued for 24 hours. Medically cleared for discharge to inpatient psych treatment    Hepatitis C infection without hepatic coma Screened due to isolated transaminitis,AST 51 in an IV drug user.  HCV ab pos, HCV RNA 52,600.  Will need outpatient referral to ID clinic for non urgent care once completes treatment for active psych issues.   Bipolar disorder/depression with active suicidal ideation with plan.  Behavioral health assessment by counselor on admission.  Patient meets criteria for inpatient psychiatric treatment, patient is medically cleared. Transferring to [facility] for inpatient psych treatment   Consultations:  none  Procedures/Studies: none Subjective: No acute complaints Discharge Exam: Vitals:   07/29/19 2119 07/30/19 0641  BP: 115/75 114/85  Pulse: 85 80  Resp: 18 15  Temp: 98.3 F (36.8 C) 98 F (36.7 C)  SpO2: 100% 99%   Vitals:   07/29/19 0606 07/29/19 1422 07/29/19 2119 07/30/19 0641  BP: 108/67 116/78 115/75 114/85  Pulse: 86 91 85 80  Resp: Temp: 98.4  F (36.9 C) 98.6 F (37 C) 98.3 F (36.8 C) 98 F (36.7 C)   TempSrc: Oral Oral Oral Oral  SpO2: 100% 100% 100% 99%  Weight:      Height:        Awake Alert, Oriented X 3, No new F.N deficits, Normal affect Poor dentition Symmetrical Chest wall movement, Good air movement bilaterally, CTAB RRR,No Gallops,Rubs or new Murmurs, No Parasternal Heave +ve B.Sounds, Abd Soft, No tenderness,, No rebound - guarding or rigidity. No rashes, no bruising   Discharge Diagnoses:  Active Problems:   Severe recurrent major depression without psychotic features ([place])   Substance induced mood disorder (HCC)   Acute febrile illness   Hepatitis C antibody positive in blood   IVDU (intravenous drug user)   Suicidal ideations   Bipolar disorder [facility])    Discharge Instructions  Discharge Instructions    Diet - low sodium heart healthy   Complete by: As directed    Increase activity slowly   Complete by: As directed      Allergies as of 07/30/2019   No Known Allergies     Medication List    STOP taking these medications   amoxicillin 500 MG capsule Commonly known as: AMOXIL   amoxicillin 500 MG tablet Commonly known as: AMOXIL     TAKE these medications   acetaminophen 500 MG tablet Commonly known as: TYLENOL Take 1,000 mg by mouth every 6 (six) hours as needed for moderate pain.       No Known Allergies      The results of significant diagnostics from this hospitalization (including imaging, microbiology, ancillary and laboratory) are listed below for reference.     Microbiology: Recent Results (from the past 240 hour(s))  SARS Coronavirus 2 by RT PCR (hospital order, performed in [facility] hospital lab) Nasopharyngeal Nasopharyngeal Swab     Status: None   Collection Time: 07/28/19 12:01 AM   Specimen: Nasopharyngeal Swab  Result Value Ref Range Status   SARS Coronavirus 2 NEGATIVE NEGATIVE Final    Comment: (NOTE) If result is NEGATIVE SARS-CoV-2 target nucleic acids are NOT DETECTED. The SARS-CoV-2 RNA is generally  detectable in upper and lower  respiratory specimens during the acute phase of infection. The lowest  concentration of SARS-CoV-2 viral copies this assay can detect is 250  copies / mL. A negative result does not preclude SARS-CoV-2 infection  and should not be used as the sole basis for treatment or other  patient management decisions.  A negative result may occur with  improper specimen collection / handling, submission of specimen other  than nasopharyngeal swab, presence of viral mutation(s) within the  areas targeted by this assay, and inadequate number of viral copies  (<250 copies / mL). A negative result must be combined with clinical  observations, patient history, and epidemiological information. If result is POSITIVE SARS-CoV-2 target nucleic acids are DETECTED. The SARS-CoV-2 RNA is generally detectable in upper and lower  respiratory specimens dur ing the acute phase of infection.  Positive  results are indicative of active infection with SARS-CoV-2.  Clinical  correlation with patient history and other diagnostic information is  necessary to determine patient infection status.  Positive results do  not rule out bacterial infection or co-infection with other viruses. If result is PRESUMPTIVE POSTIVE SARS-CoV-2 nucleic acids MAY BE PRESENT.   A presumptive positive result was obtained on the submitted specimen  and confirmed on repeat testing.  While 2019 novel coronavirus  (SARS-CoV-2) nucleic  acids may be present in the submitted sample  additional confirmatory testing may be necessary for epidemiological  and / or clinical management purposes  to differentiate between  SARS-CoV-2 and other Sarbecovirus currently known to infect humans.  If clinically indicated additional testing with an alternate test  methodology [phone]) is advised. The SARS-CoV-2 RNA is generally  detectable in upper and lower respiratory sp ecimens during the acute  phase of infection. The  expected result is Negative. Fact Sheet for Patients:  BoilerBrush.com.cy Fact Sheet for Healthcare Providers: https://pope.com/ This test is not yet approved or cleared by the [place] FDA and has been authorized for detection and/or diagnosis of SARS-CoV-2 by FDA under an Emergency Use Authorization (EUA).  This EUA will remain in effect (meaning this test can be used) for the duration of the COVID-19 declaration under Section 564(b)(1) of the Act, 21 U.S.C. section 360bbb-3(b)(1), unless the authorization is terminated or revoked sooner. Performed at [facility], [address] [place], [place] [address]   Blood culture (routine x 2)     Status: None (Preliminary result)   Collection Time: 07/28/19  3:00 AM   Specimen: BLOOD  Result Value Ref Range Status   Specimen Description   Final    BLOOD LEFT WRIST Performed at [facility], [address], [place], [place] [address]    Special Requests   Final    BOTTLES DRAWN AEROBIC AND ANAEROBIC Blood Culture adequate volume Performed at [facility], [address], [place], [place] [address]    Culture   Final    NO GROWTH 2 DAYS Performed at [facility], [address], [place], [place] [address]    Report Status PENDING  Incomplete  Blood culture (routine x 2)     Status: None (Preliminary result)   Collection Time: 07/28/19 11:23 AM   Specimen: BLOOD  Result Value Ref Range Status   Specimen Description   Final    BLOOD LEFT ANTECUBITAL Performed at [facility], [address] [place], [place] [address]    Special Requests   Final    BOTTLES DRAWN AEROBIC ONLY Blood Culture adequate volume Performed at [facility], [address], [place], [place] [address]    Culture   Final    NO GROWTH 2 DAYS Performed at [facility], [address], [place], [place]  [address]    Report Status PENDING  Incomplete     Labs: BNP (last 3 results) No results for input(s): BNP in the last 8760 hours. Basic Metabolic Panel: Recent Labs  Lab 07/28/19 0007 07/28/19 0900 07/29/19 0837  NA 137 140 139  K 4.2 4.0 4.6  CL 96* 102 106  CO2 GLUCOSE 107* 103* 103*  BUN CREATININE 0.85 0.83 0.70  CALCIUM 9.4 8.9 8.9   Liver Function Tests: Recent Labs  Lab 07/28/19 0007 07/28/19 0900  AST 51* 42*  ALT 73* 57*  ALKPHOS 83 66  BILITOT 0.6 0.5  PROT 8.2* 6.5  ALBUMIN 4.4 3.4*   No results for input(s): LIPASE, AMYLASE in the last 168 hours. No results for input(s): AMMONIA in the last 168 hours. CBC: Recent Labs  Lab 07/28/19 0007 07/28/19 0900  WBC 7.3 7.1  HGB 15.6 15.3  HCT 48.0 46.9  MCV 91.4 91.4  PLT 216 206   Cardiac Enzymes: No results for input(s): CKTOTAL, CKMB, CKMBINDEX, TROPONINI in  the last 168 hours. BNP: Invalid input(s): POCBNP CBG: No results for input(s): GLUCAP in the last 168 hours. D-Dimer No results for input(s): DDIMER in the last 72 hours. Hgb A1c No results for input(s): HGBA1C in the last 72 hours. Lipid Profile No results for input(s): CHOL, HDL, LDLCALC, TRIG, CHOLHDL, LDLDIRECT in the last 72 hours. Thyroid function studies No results for input(s): TSH, T4TOTAL, T3FREE, THYROIDAB in the last 72 hours.  Invalid input(s): FREET3 Anemia work up No results for input(s): VITAMINB12, FOLATE, FERRITIN, TIBC, IRON, RETICCTPCT in the last 72 hours. Urinalysis    Component Value Date/Time   COLORURINE YELLOW 07/28/2019 0315   APPEARANCEUR CLEAR 07/28/2019 0315   LABSPEC 1.016 07/28/2019 0315   PHURINE 6.0 07/28/2019 0315   GLUCOSEU NEGATIVE 07/28/2019 0315   HGBUR NEGATIVE 07/28/2019 0315   BILIRUBINUR NEGATIVE 07/28/2019 0315   KETONESUR NEGATIVE 07/28/2019 0315   PROTEINUR NEGATIVE 07/28/2019 0315   UROBILINOGEN 1.0 07/26/2012 1922   NITRITE NEGATIVE 07/28/2019 0315   LEUKOCYTESUR  NEGATIVE 07/28/2019 0315   Sepsis Labs Invalid input(s): PROCALCITONIN,  WBC,  LACTICIDVEN Microbiology Recent Results (from the past 240 hour(s))  SARS Coronavirus 2 by RT PCR (hospital order, performed in [facility] hospital lab) Nasopharyngeal Nasopharyngeal Swab     Status: None   Collection Time: 07/28/19 12:01 AM   Specimen: Nasopharyngeal Swab  Result Value Ref Range Status   SARS Coronavirus 2 NEGATIVE NEGATIVE Final    Comment: (NOTE) If result is NEGATIVE SARS-CoV-2 target nucleic acids are NOT DETECTED. The SARS-CoV-2 RNA is generally detectable in upper and lower  respiratory specimens during the acute phase of infection. The lowest  concentration of SARS-CoV-2 viral copies this assay can detect is 250  copies / mL. A negative result does not preclude SARS-CoV-2 infection  and should not be used as the sole basis for treatment or other  patient management decisions.  A negative result may occur with  improper specimen collection / handling, submission of specimen other  than nasopharyngeal swab, presence of viral mutation(s) within the  areas targeted by this assay, and inadequate number of viral copies  (<250 copies / mL). A negative result must be combined with clinical  observations, patient history, and epidemiological information. If result is POSITIVE SARS-CoV-2 target nucleic acids are DETECTED. The SARS-CoV-2 RNA is generally detectable in upper and lower  respiratory specimens dur ing the acute phase of infection.  Positive  results are indicative of active infection with SARS-CoV-2.  Clinical  correlation with patient history and other diagnostic information is  necessary to determine patient infection status.  Positive results do  not rule out bacterial infection or co-infection with other viruses. If result is PRESUMPTIVE POSTIVE SARS-CoV-2 nucleic acids MAY BE PRESENT.   A presumptive positive result was obtained on the submitted specimen  and confirmed  on repeat testing.  While 2019 novel coronavirus  (SARS-CoV-2) nucleic acids may be present in the submitted sample  additional confirmatory testing may be necessary for epidemiological  and / or clinical management purposes  to differentiate between  SARS-CoV-2 and other Sarbecovirus currently known to infect humans.  If clinically indicated additional testing with an alternate test  methodology [phone]) is advised. The SARS-CoV-2 RNA is generally  detectable in upper and lower respiratory sp ecimens during the acute  phase of infection. The expected result is Negative. Fact Sheet for Patients:  BoilerBrush.com.cy Fact Sheet for Healthcare Providers: https://pope.com/ This test is not yet approved or cleared by the [place]  FDA and has been authorized for detection and/or diagnosis of SARS-CoV-2 by FDA under an Emergency Use Authorization (EUA).  This EUA will remain in effect (meaning this test can be used) for the duration of the COVID-19 declaration under Section 564(b)(1) of the Act, 21 U.S.C. section 360bbb-3(b)(1), unless the authorization is terminated or revoked sooner. Performed at [facility], [address] [place], [place] [address]   Blood culture (routine x 2)     Status: None (Preliminary result)   Collection Time: 07/28/19  3:00 AM   Specimen: BLOOD  Result Value Ref Range Status   Specimen Description   Final    BLOOD LEFT WRIST Performed at [facility], [address] [place], [place] [address]    Special Requests   Final    BOTTLES DRAWN AEROBIC AND ANAEROBIC Blood Culture adequate volume Performed at [facility], [address] [place], [place] [address]    Culture   Final    NO GROWTH 2 DAYS Performed at [facility], [address] [place], [place] [address]    Report Status PENDING  Incomplete  Blood culture (routine x 2)      Status: None (Preliminary result)   Collection Time: 07/28/19 11:23 AM   Specimen: BLOOD  Result Value Ref Range Status   Specimen Description   Final    BLOOD LEFT ANTECUBITAL Performed at [facility], [address] [place], [place] [address]    Special Requests   Final    BOTTLES DRAWN AEROBIC ONLY Blood Culture adequate volume Performed at [facility], [address] [place], [place] [address]    Culture   Final    NO GROWTH 2 DAYS Performed at [facility], [address] [place], [place] [address]    Report Status PENDING  Incomplete     Time coordinating discharge: Over 30 minutes  SIGNED:   Laverna PeaceShayla D Nettey, MD  [facility] 07/30/2019, 12:41 PM Pager   If 7PM-7AM, please contact night-coverage [email or web address] Password [email or web address]

## 2019-07-30 NOTE — TOC Progression Note (Addendum)
Transition of Care [facility]) - Progression Note    Patient Details  Name: Caleb Ellis MRN: [id number] Date of Birth: 27-Feb-1991  Transition of Care [facility]) CM/SW [place], [place] Phone Number: [phone] 07/30/2019, 10:22 AM  Clinical Narrative:   Have requested [facility] and [facility] review pt for admission today. Will follow up. Also referred to: [facility], [place] (for [employer] funded bed as pt uninsured)  11:57 Pt accepted to [employer] [facility] [address] by Dr. Dareen Piano, report [phone]. Will arrange voluntary transport through [place]    Expected Discharge Plan: [facility] Barriers to Discharge: Psych Bed not available  Expected Discharge Plan and Services Expected Discharge Plan: [facility] In-house Referral: Clinical Social Work       Social Determinants of Health (SDOH) Interventions    Readmission Risk Interventions No flowsheet data found.

## 2019-08-02 LAB — CULTURE, BLOOD (ROUTINE X 2)
Culture: NO GROWTH
Culture: NO GROWTH
Special Requests: ADEQUATE
Special Requests: ADEQUATE

## 2019-12-17 IMAGING — DX PORTABLE CHEST - 1 VIEW
1 series · 1 of 1 positions shown · non-contrast
Comparison: None.

CLINICAL DATA: Fever and dental pain

EXAM:
PORTABLE CHEST 1 VIEW

[chest ap]
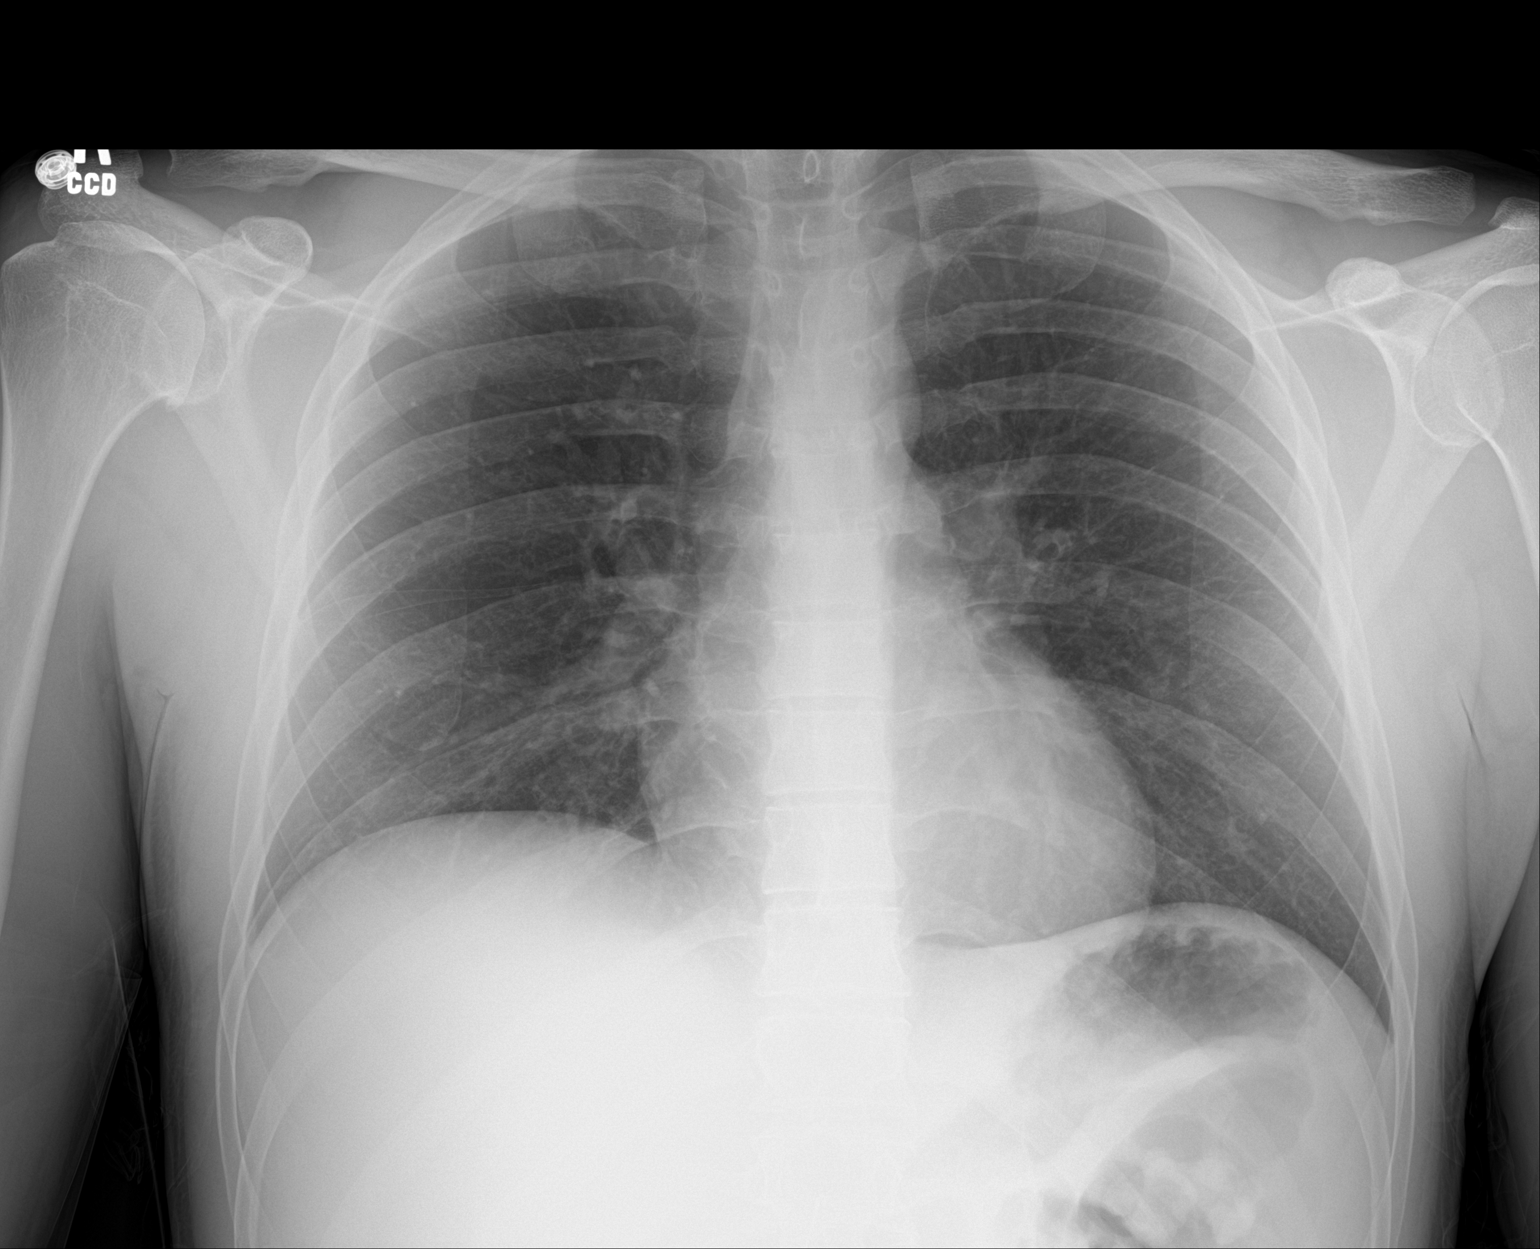

[1 of 1 positions shown; findings below may reference images not displayed]

FINDINGS: The heart size and mediastinal contours are within normal limits.
Both lungs are clear. The visualized skeletal structures are
unremarkable.
IMPRESSION: No active disease.

## 2020-01-16 ENCOUNTER — Encounter (HOSPITAL_COMMUNITY): Payer: Self-pay | Admitting: Emergency Medicine

## 2020-01-16 ENCOUNTER — Emergency Department (HOSPITAL_COMMUNITY)
Admission: EM | Admit: 2020-01-16 | Discharge: 2020-01-16 | Disposition: A | Discharge status: Home / Self Care | Payer: Self-pay | Attending: Emergency Medicine | Admitting: Emergency Medicine

## 2020-01-16 ENCOUNTER — Other Ambulatory Visit: Payer: Self-pay

## 2020-01-16 DIAGNOSIS — F17210 Nicotine dependence, cigarettes, uncomplicated: Secondary | ICD-10-CM | POA: Insufficient documentation

## 2020-01-16 DIAGNOSIS — K029 Dental caries, unspecified: Secondary | ICD-10-CM | POA: Insufficient documentation

## 2020-01-16 MED ORDER — PENICILLIN V POTASSIUM 500 MG PO TABS: 500.0000 mg | ORAL_TABLET | Freq: Three times a day (TID) | ORAL | 0 refills | Status: DC

## 2020-01-16 MED ORDER — IBUPROFEN 600 MG PO TABS: 600.0000 mg | ORAL_TABLET | Freq: Four times a day (QID) | ORAL | 0 refills | Status: DC | PRN

## 2020-01-16 NOTE — ED Triage Notes (Signed)
Patient complaining of dental pain. He also was some ointment for the tracks on his arm.

## 2020-01-16 NOTE — ED Provider Notes (Signed)
[facility]-EMERGENCY DEPT Provider Note   CSN: [id number] Arrival date & time: 01/16/20  2836     History Chief Complaint  Patient presents with  . Dental Pain    Caleb Ellis is a 29 y.o. male.  The history is provided by the patient and medical records. No language interpreter was used.  Dental Pain Associated symptoms: no fever      29 year old male with history of IV drug use, hepatitis C, depression, presenting for evaluation of dental pain.  Patient report for the past week he has had persistent dental pain.  Pain is primarily to his right lower teeth in which he described as a throbbing persistent pain, 11 out of 10, worse with chewing but not with temperature changes.  No fever, neck pain, hearing changes, ear pain or facial swelling.  He tried using Orajel at home without relief.  He does not have a dentist and states he cannot afford it.  Past Medical History:  Diagnosis Date  . ADHD (attention deficit hyperactivity disorder)   . Bipolar disorder (HCC)   . Depression   . Drug abuse (HCC)   . Outbursts of anger     Patient Active Problem List   Diagnosis Date Noted  . Hepatitis C virus infection without hepatic coma 07/30/2019  . Hepatitis C antibody positive in blood 07/29/2019  . IVDU (intravenous drug user) 07/29/2019  . Suicidal ideations 07/29/2019  . Bipolar disorder (HCC) 07/29/2019  . Acute febrile illness 07/28/2019  . Substance induced mood disorder (HCC) 10/15/2017  . Severe recurrent major depression without psychotic features (HCC) 10/14/2017    History reviewed. No pertinent surgical history.     History reviewed. No pertinent family history.  Social History   Tobacco Use  . Smoking status: Current Every Day Smoker    Packs/day: 0.50    Types: Cigarettes  . Smokeless tobacco: Never Used  Substance Use Topics  . Alcohol use: Yes    Comment: occ  . Drug use: Yes    Types: Marijuana, Methamphetamines    Comment:  Heroine    Home Medications Prior to Admission medications   Medication Sig Start Date End Date Taking? Authorizing Provider  acetaminophen (TYLENOL) 500 MG tablet Take 1,000 mg by mouth every 6 (six) hours as needed for moderate pain.    [provider]    Allergies    Patient has no known allergies.  Review of Systems   Review of Systems  Constitutional: Negative for fever.  HENT: Positive for dental problem.     Physical Exam Updated Vital Signs BP (!) 124/98 (BP Location: Right Arm)   Pulse 94   Temp 98 F (36.7 C) (Oral)   Resp 18   Ht 5\' 8"  (1.727 m)   Wt 63.5 kg   SpO2 100%   BMI 21.29 kg/m   Physical Exam Vitals and nursing note reviewed.  Constitutional:      General: He is not in acute distress.    Appearance: He is well-developed.  HENT:     Head: Atraumatic.     Mouth/Throat:     Comments: Significant dental decay noted to tooth #31-32 tender to palpation without gingival erythema and no facial involvement.  No trismus. Eyes:     Conjunctiva/sclera: Conjunctivae normal.  Musculoskeletal:     Cervical back: Neck supple. No rigidity.  Lymphadenopathy:     Cervical: No cervical adenopathy.  Skin:    Findings: No rash.  Neurological:  Mental Status: He is alert.     ED Results / Procedures / Treatments   Labs (all labs ordered are listed, but only abnormal results are displayed) Labs Reviewed - No data to display  EKG None  Radiology No results found.  Procedures Procedures (including critical care time)  Medications Ordered in ED Medications - No data to display  ED Course  I have reviewed the triage vital signs and the nursing notes.  Pertinent labs & imaging results that were available during my care of the patient were reviewed by me and considered in my medical decision making (see chart for details).    MDM Rules/Calculators/A&P                      BP (!) 124/98 (BP Location: Right Arm)   Pulse 94   Temp 98 F  (36.7 C) (Oral)   Resp 18   Ht 5\' 8"  (1.727 m)   Wt 63.5 kg   SpO2 100%   BMI 21.29 kg/m   Final Clinical Impression(s) / ED Diagnoses Final diagnoses:  Pain due to dental caries    Rx / DC Orders ED Discharge Orders    None     Patient with dentalgia.  No abscess requiring immediate incision and drainage.  Exam not concerning for Ludwig's angina or pharyngeal abscess.  Will treat with NSAIDs and ABX. Pt instructed to follow-up with dentist.  Discussed return precautions. Pt safe for discharge.    Domenic Moras, PA-C 01/16/20 6440    Davonna Belling, MD 01/16/20 [phone]

## 2020-01-16 NOTE — Discharge Instructions (Signed)
You have been diagnosed with Dental pain. Please call the follow up dentist first thing in the morning on Monday for a follow up appointment. Keep your discharge paperwork from today's visit to bring to the dentist office. You may also use the resource guide listed below to help you find a dentist if you do not already have one to followup with. It is very important that you get evaluated by a dentist as soon as possible. Use your pain medication as prescribed. Take your full course of antibiotics. Read the instructions below.  °Eat a soft or liquid diet and rinse your mouth out after meals with warm water. You should see a dentist or return here at once if you have increased swelling, increased pain or uncontrolled bleeding from the site of your injury.  °SEEK MEDICAL CARE IF:  °You have increased pain not controlled with medicines.  °You have swelling around your tooth, in your face or neck.  °You have bleeding which starts, continues, or gets worse.  °You have a fever >101  °If you are unable to open your mouth °Soft Diet  °The soft diet may be recommended after you were put on a full liquid diet. A normal diet may follow. The soft diet can also be used after surgery if you are too ill to keep down a normal diet. The soft diet may also be needed if you have a hard time chewing foods.  °DESCRIPTION  °Tender foods are used. Foods do not need to be ground or pureed. Most raw fruits and vegetables and coarse breads and cereals should be avoided. Fried foods and highly seasoned foods may cause discomfort.  °NUTRITIONAL ADEQUACY  °A healthy diet is possible if foods from each of the basic food groups are eaten daily.  °SOFT DIET FOOD LISTS  °Milk/Dairy  °Allowed: Milk and milk drinks, milk shakes, cream cheese, cottage cheese, mild cheeses.  °Avoid: Sharp or highly seasoned cheese. °Meat/Meat Substitutes  °Allowed: Broiled, roasted, baked, or stewed tender lean beef, mutton, lamb, veal, chicken, turkey, liver, ham,  crisp bacon, white fish, tuna, salmon. Eggs, smooth peanut butter.  °Avoid: All fried meats, fish, or fowl. Rich gravies and sauces. Lunch meats, sausages, hot dogs. Meats with gristle, chunky peanut butter. °Breads/Grains  °Allowed: Rice, noodles, spaghetti, macaroni. Dry or cooked refined cereals, such as farina, cream of wheat, oatmeal, grits, whole-wheat cereals. Plain or toasted white or wheat blend or whole-grain breads, soda crackers or saltines, flour tortillas.  °Avoid: Wild rice, coarse cereals, such as bran. Seed in or on breads and crackers. Bread or bread products with nuts or seeds. °Fruits/Vegetables  °Allowed: Fruit and vegetable juices, well-cooked or canned fruits and vegetables, any dried fruit. One citrus fruit daily, 1 vitamin A source daily. Well-ripened, easy to chew fruits, sweet potatoes. Baked, boiled, mashed, creamed, scalloped, or au gratin potatoes. Broths or creamed soups made with allowed vegetables, strained tomatoes.  °Avoid: All gas-forming vegetables (corn, radishes, Brussels sprouts, onions, broccoli, cabbage, parsnips, turnips, chili peppers, pinto beans, split peas, dried beans). Fruits containing seeds and skin. Potato chips and corn chips. All others that are not made with allowed vegetables. Highly seasoned soups. °Desserts/Sweets  °Allowed: Simple desserts, such as custard, junkets, gelatin desserts, plain ice cream and sherbets, simple cakes and cookies, allowed fruits, sugar, syrup, jelly, honey, plain hard candy, and molasses.  °Avoid: Rich pastries, any dessert containing dates, nuts, raisins, or coconut. Fried pastries, such as doughnuts. Chocolate. °Beverages  °Allowed: Fruit and vegetable juices. Caffeine-free   carbonated drinks, coffee, and tea.  °Avoid: Caffeinated beverages: coffee, tea, soda or pop. °Miscellaneous  °Allowed: Butter, cream, margarine, mayonnaise, oil. Cream sauces, salt, and mild spices.  °Avoid: Highly spiced salad dressings. Highly seasoned foods,  hot sauce, mustard, horseradish, and pepper. °SAMPLE MENU  °Breakfast  °Orange juice.  °Oatmeal.  °Soft cooked egg.  °Toast and margarine.  °2% milk.  °Coffee. °Lunch  °Meatloaf.  °Mashed potato.  °Green beans.  °Lemon pudding.  °Bread and margarine.  °Coffee. °Dinner  °Consommé or apricot nectar.  °Chicken breast.  °Rice, peas, and carrots.  °Applesauce.  °Bread and margarine.  °2% milk. °To cut the amount of fat in your diet, omit margarine and use 1% or skim milk.  °NUTRIENT ANALYSIS  °Calories........................1953 Kcal.  °Protein.........................102 gm.  °Carbohydrate...............247 gm.  °Fat................................65 gm.  °Cholesterol...................449 mg.  °Dietary fiber.................19 gm.  °Vitamin A.....................2944 RE.  °Vitamin C.....................79 mg.  °Niacin..........................25 mg.  °Riboflavin....................2.0 mg.  °Thiamin.......................1.5 mg.  °Folate..........................249 mcg.  °Calcium.......................1030 mg.  °Phosphorus.................1782 mg.  °Zinc..............................12 mg.  °Iron..............................13 mg.  °Sodium.........................299 mg.  °Potassium....................3046 mg. °Document Released: 01/03/2008 Document Revised: 12/19/2011 Document Reviewed: 01/03/2008  °ExitCare® Patient Information ©2014 [employer].  °RESOURCE GUIDE  °Dental Problems  °Dr. Janna Civilis  °$200 dollar visit  °[address]  °[place], [place] [address]  °[phone]  °Patients with Medicaid:  °[facility]  °[address]  °Phone: [phone] Phone: [phone]  °If unable to pay or uninsured, contact: [facility] or [facility] to become qualified for the adult dental clinic.  °Chronic Pain Problems  °Contact [facility] [phone]  °Patients need to be referred by their primary care doctor.  °Insufficient  Money for Medicine  °Contact [employer]: call "211" or [facility] 271-5999.  °No Primary Care Doctor  °Call [facility] 832-8000  °Other agencies that provide inexpensive medical care  °[facility] 832-8035  °[facility] [phone]  °[facility] [phone]  °[facility] Clinic 832-4777  °[facility] 373-0678  °[facility] 272-1050  °Psychological Services  °[facility] [phone]  °[facility] [phone]  °[facility] 800 853-5163 (emergency services 641-4993)  °Substance Abuse Resources  °[facility] 336-882-2125  °[facility] 336-784-9470  °[facility] 336-285-9073  °[facility] 336-845-3988  °Residential & Outpatient Substance Abuse Program 800-659-3381  °Abuse/Neglect  °[place] [facility] (336) 641-3795  °[place] [facility] 800-378-5315 (After Hours)  °Emergency Shelter  ° [facility] (336) 271-5985  °Maternity Homes  °[facility] (336) 275-9566  °[facility] (704) 372-4663  °MRSA Hotline #: 832-7006  °[place] [facility]  °[facility] [employer] [facility]  °[address] [place] [address]  °[place]  °Phone: 349-3220 Phone: [phone] Phone: [phone]  °[facility]  °Phone: 342-8316  °[place] [facility]  °(336) 342-1394  °(336) 342-3537 (After Hours) ° °

## 2020-01-29 ENCOUNTER — Encounter (HOSPITAL_COMMUNITY): Payer: Self-pay

## 2020-01-29 ENCOUNTER — Other Ambulatory Visit: Payer: Self-pay

## 2020-01-29 ENCOUNTER — Emergency Department (HOSPITAL_COMMUNITY)
Admission: EM | Admit: 2020-01-29 | Discharge: 2020-01-29 | Disposition: A | Discharge status: Home / Self Care | Payer: Self-pay | Attending: Emergency Medicine | Admitting: Emergency Medicine

## 2020-01-29 DIAGNOSIS — F11188 Opioid abuse with other opioid-induced disorder: Secondary | ICD-10-CM | POA: Insufficient documentation

## 2020-01-29 DIAGNOSIS — Z79899 Other long term (current) drug therapy: Secondary | ICD-10-CM | POA: Insufficient documentation

## 2020-01-29 DIAGNOSIS — F17210 Nicotine dependence, cigarettes, uncomplicated: Secondary | ICD-10-CM | POA: Insufficient documentation

## 2020-01-29 DIAGNOSIS — L03113 Cellulitis of right upper limb: Secondary | ICD-10-CM | POA: Insufficient documentation

## 2020-01-29 MED ORDER — DOXYCYCLINE HYCLATE 100 MG PO CAPS: 100.0000 mg | ORAL_CAPSULE | Freq: Two times a day (BID) | ORAL | 0 refills | Status: DC

## 2020-01-29 NOTE — Discharge Instructions (Addendum)
You were seen today for right arm swelling.  You have redness and swelling consistent with cellulitis.  This is likely related to your known drug abuse.  Take antibiotics as prescribed.  If you note that the redness extends beyond the marked region or you have increasing swelling and pain, you need to be reevaluated.

## 2020-01-29 NOTE — ED Triage Notes (Signed)
Patient arrived stating yesterday he started noticing some redness and swelling on his right forearm. Reports he is an IV drug user.

## 2020-01-29 NOTE — ED Provider Notes (Signed)
[facility] DEPT Provider Note   CSN: [id number] Arrival date & time: 01/29/20  0358     History Chief Complaint  Patient presents with  . Wound Infection    Caleb Ellis is a 29 y.o. male.  HPI     This is a 29 year old male with a history of bipolar disorder and polysubstance abuse who presents with right arm pain, swelling, redness.  Onset of symptoms yesterday.  He last shot up heroin yesterday.  He reports that he uses almost daily.  He reports that he uses clean needles.  He shot up yesterday and then began to note pain and swelling as well as redness and warmth to the right arm.  He is not noted any fevers.  He rates his pain at 5 out of 10.  He states it is worse with palpation.  Past Medical History:  Diagnosis Date  . ADHD (attention deficit hyperactivity disorder)   . Bipolar disorder ([place])   . Depression   . Drug abuse ([place])   . Outbursts of anger     Patient Active Problem List   Diagnosis Date Noted  . Hepatitis C virus infection without hepatic coma 07/30/2019  . Hepatitis C antibody positive in blood 07/29/2019  . IVDU (intravenous drug user) 07/29/2019  . Suicidal ideations 07/29/2019  . Bipolar disorder ([place]) 07/29/2019  . Acute febrile illness 07/28/2019  . Substance induced mood disorder ([place]) 10/15/2017  . Severe recurrent major depression without psychotic features ([place]) 10/14/2017    History reviewed. No pertinent surgical history.     No family history on file.  Social History   Tobacco Use  . Smoking status: Current Every Day Smoker    Packs/day: 0.50    Types: Cigarettes  . Smokeless tobacco: Never Used  Substance Use Topics  . Alcohol use: Yes    Comment: occ  . Drug use: Yes    Types: Marijuana, Methamphetamines    Comment: Heroine    Home Medications Prior to Admission medications   Medication Sig Start Date End Date Taking? Authorizing Provider  acetaminophen (TYLENOL) 500 MG tablet Take  1,000 mg by mouth every 6 (six) hours as needed for moderate pain.    [provider]  doxycycline (VIBRAMYCIN) 100 MG capsule Take 1 capsule (100 mg total) by mouth 2 (two) times daily. 01/29/20   Pecolia Marando, Barbette Hair, MD  ibuprofen (ADVIL) 600 MG tablet Take 1 tablet (600 mg total) by mouth every 6 (six) hours as needed. 01/16/20   Domenic Moras, PA-C  penicillin v potassium (VEETID) 500 MG tablet Take 1 tablet (500 mg total) by mouth 3 (three) times daily. 01/16/20   Domenic Moras, PA-C    Allergies    Patient has no known allergies.  Review of Systems   Review of Systems  Constitutional: Negative for fever.  Respiratory: Negative for shortness of breath.   Cardiovascular: Negative for chest pain.  Skin: Positive for color change. Negative for wound.  All other systems reviewed and are negative.   Physical Exam Updated Vital Signs BP 124/88 (BP Location: Right Arm)   Pulse (!) 105   Temp 98.3 F (36.8 C) (Oral)   Resp 18   SpO2 98%   Physical Exam Vitals and nursing note reviewed.  Constitutional:      Appearance: Normal appearance. He is well-developed. He is not ill-appearing.  HENT:     Head: Normocephalic and atraumatic.     Mouth/Throat:     Mouth: Mucous membranes  are moist.  Eyes:     Pupils: Pupils are equal, round, and reactive to light.  Cardiovascular:     Rate and Rhythm: Normal rate and regular rhythm.  Pulmonary:     Effort: Pulmonary effort is normal. No respiratory distress.  Abdominal:     Palpations: Abdomen is soft.     Tenderness: There is no abdominal tenderness. There is no rebound.  Musculoskeletal:     Cervical back: Neck supple.     Comments: Slight swelling of the right forearm with tenderness to palpation proximally, no obvious fluctuance, slight erythema  Lymphadenopathy:     Cervical: No cervical adenopathy.  Skin:    General: Skin is warm and dry.     Comments: Track marks bilateral upper extremities  Neurological:     Mental Status:  He is alert and oriented to person, place, and time.  Psychiatric:        Mood and Affect: Mood normal.     ED Results / Procedures / Treatments   Labs (all labs ordered are listed, but only abnormal results are displayed) Labs Reviewed - No data to display  EKG None  Radiology No results found.  Procedures Procedures (including critical care time)  Medications Ordered in ED Medications - No data to display  ED Course  I have reviewed the triage vital signs and the nursing notes.  Pertinent labs & imaging results that were available during my care of the patient were reviewed by me and considered in my medical decision making (see chart for details).    MDM Rules/Calculators/A&P                       Patient presents with redness, swelling, pain right forearm.  Reports recently shooting up in that arm.  He is overall nontoxic and vital signs reassuring.  He is afebrile.  On physical exam clinically he has signs and symptoms of cellulitis.  No fluctuance or obvious abscess.  He is certainly at risk for infection given his drug abuse.  He is systemically well-appearing and afebrile.  Do not feel he needs work-up at this time.  Area was demarcated with a marker.  Will start on doxycycline to cover for MRSA.  Bedside ultrasound shows no discrete fluid collection.  Discussed with patient return precautions including increasing redness, systemic symptoms such as fever, and increasing swelling.  Patient stated understanding.  After history, exam, and medical workup I feel the patient has been appropriately medically screened and is safe for discharge home. Pertinent diagnoses were discussed with the patient. Patient was given return precautions.   Final Clinical Impression(s) / ED Diagnoses Final diagnoses:  Cellulitis of right upper extremity    Rx / DC Orders ED Discharge Orders         Ordered    doxycycline (VIBRAMYCIN) 100 MG capsule  2 times daily     01/29/20 0446            Tonjia Parillo, Mayer Masker, MD 01/29/20 [phone]

## 2020-02-29 IMAGING — CR DG CHEST 2V
2 series · 2 of 2 positions shown · non-contrast
Comparison: May 15, 2019

CLINICAL DATA: Fever, smoker

EXAM:
CHEST - 2 VIEW

[w chest pa]
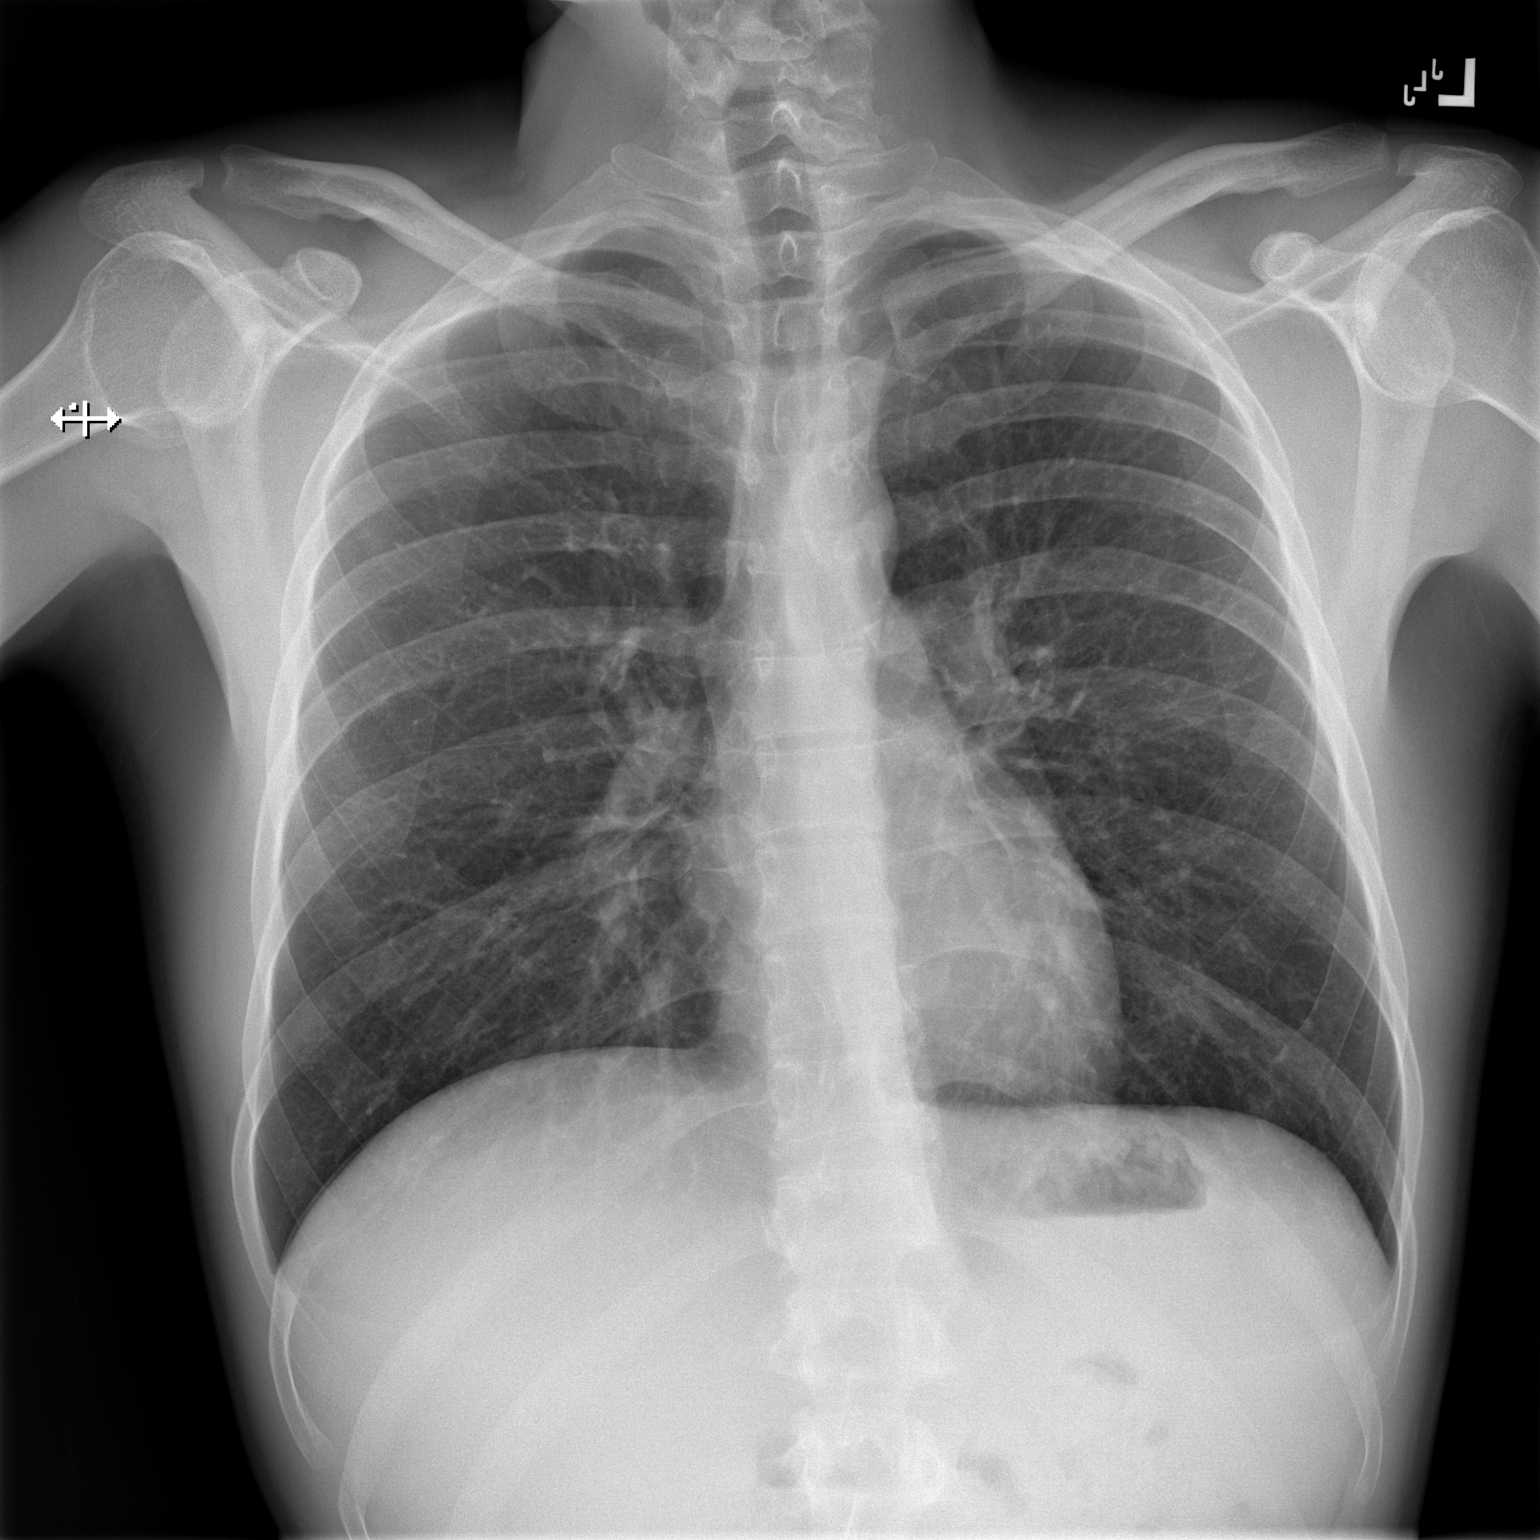

[w chest lat]
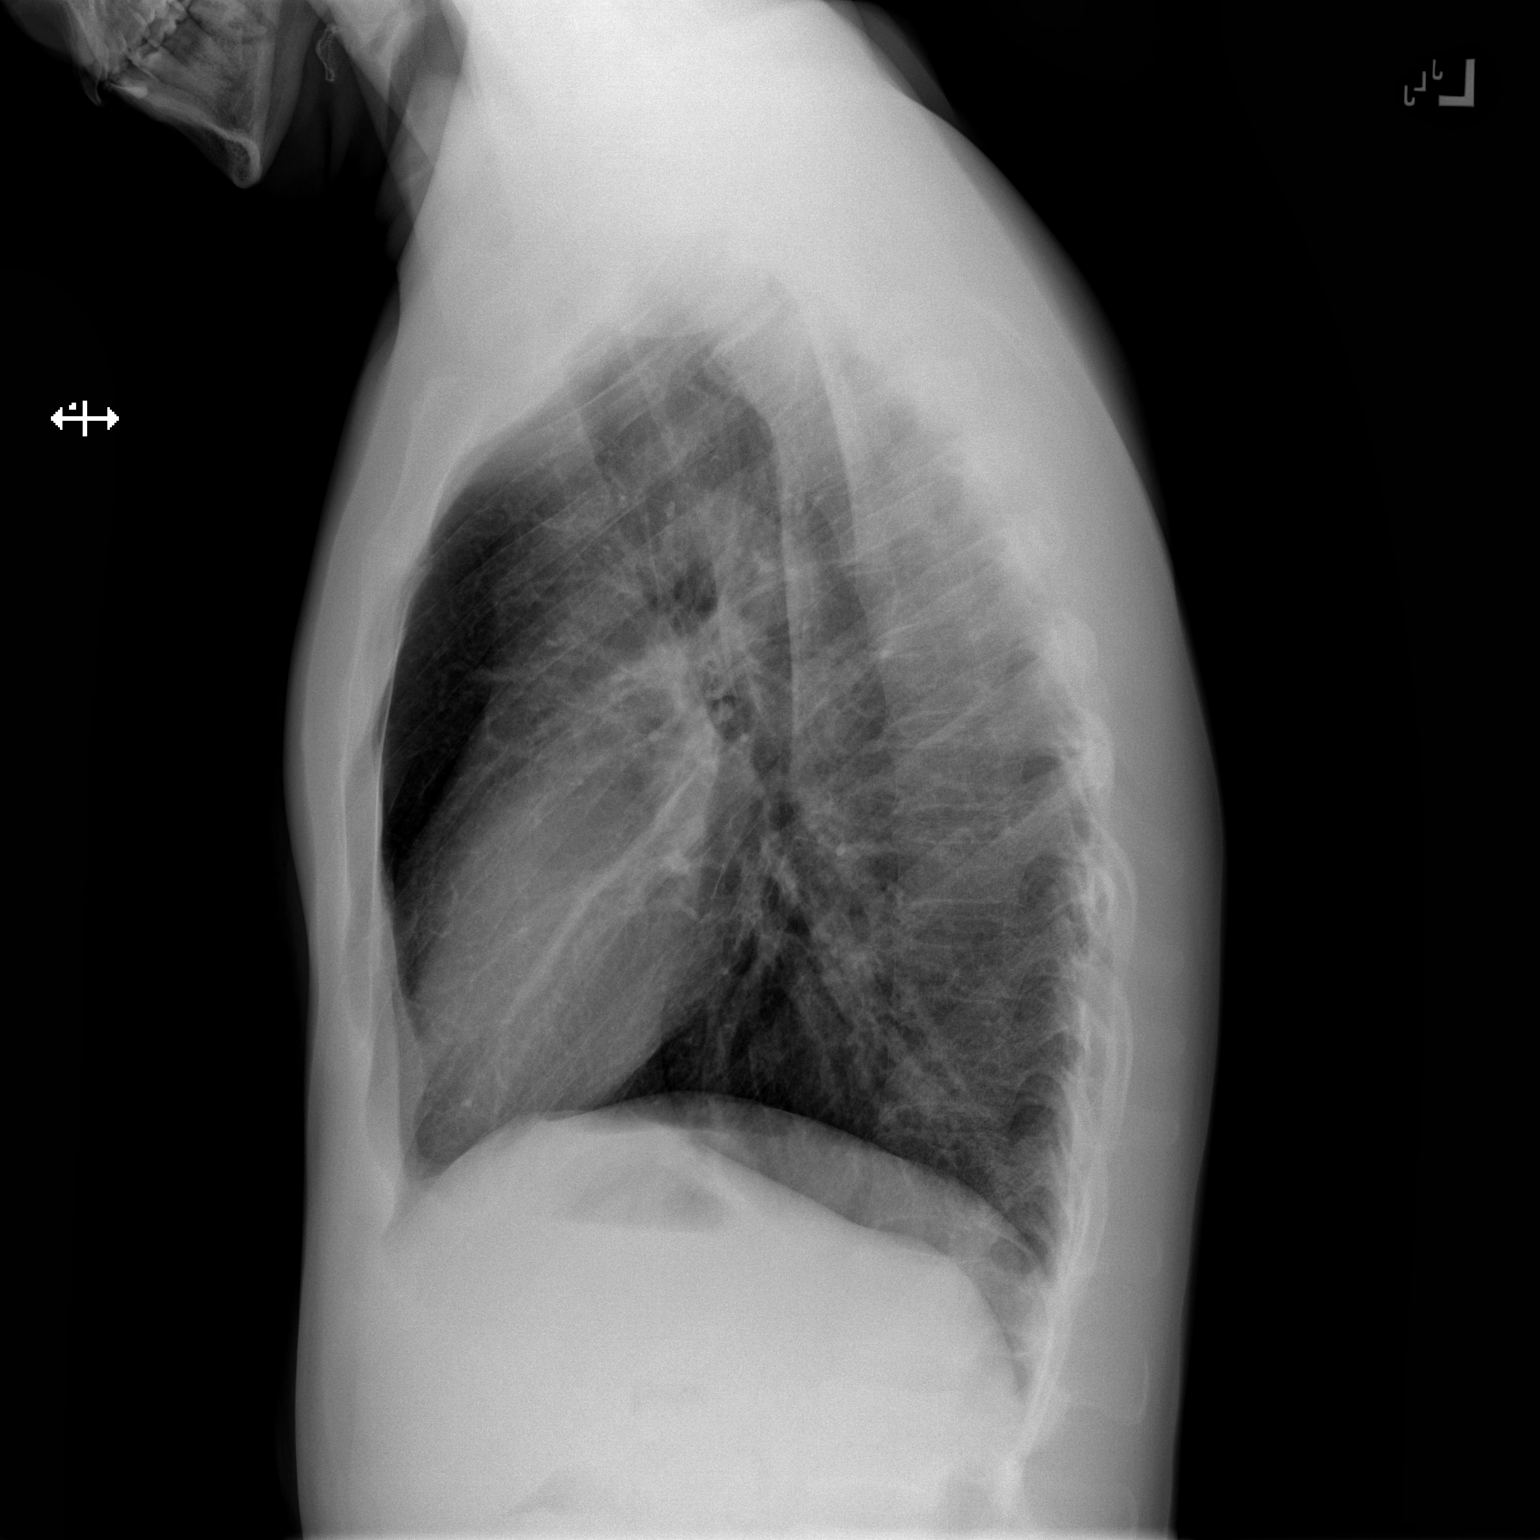

[2 of 2 positions shown; findings below may reference images not displayed]

FINDINGS: The heart size and mediastinal contours are within normal limits.
Both lungs are clear. The visualized skeletal structures are
unremarkable.
IMPRESSION: No acute cardiopulmonary process.

## 2020-04-26 ENCOUNTER — Emergency Department (HOSPITAL_COMMUNITY)
Admission: EM | Admit: 2020-04-26 | Discharge: 2020-04-26 | Disposition: A | Discharge status: Home / Self Care | Payer: Self-pay | Attending: Emergency Medicine | Admitting: Emergency Medicine

## 2020-04-26 ENCOUNTER — Encounter (HOSPITAL_COMMUNITY): Payer: Self-pay

## 2020-04-26 ENCOUNTER — Other Ambulatory Visit: Payer: Self-pay

## 2020-04-26 DIAGNOSIS — T401X1A Poisoning by heroin, accidental (unintentional), initial encounter: Secondary | ICD-10-CM | POA: Insufficient documentation

## 2020-04-26 DIAGNOSIS — F17210 Nicotine dependence, cigarettes, uncomplicated: Secondary | ICD-10-CM | POA: Insufficient documentation

## 2020-04-26 DIAGNOSIS — F1910 Other psychoactive substance abuse, uncomplicated: Secondary | ICD-10-CM | POA: Insufficient documentation

## 2020-04-26 NOTE — Discharge Instructions (Addendum)
Please be sure to follow-up with your physician and take advantage of the provided resources for assistance with heroin use.  Return here for concerning changes in your condition.

## 2020-04-26 NOTE — ED Provider Notes (Signed)
[facility]-EMERGENCY DEPT Provider Note   CSN: [id number] Arrival date & time: 04/26/20  0736     History Chief Complaint  Patient presents with  . Drug Overdose    Caleb Ellis is a 29 y.o. male.  HPI    Patient presents after acknowledged heroin overdose. Patient notes that he was shooting up this morning, then awoke with paramedics above him. Currently denies pain, discomfort, dyspnea.  He denies any suicidal ideation.  He acknowledges using heroin, has had prior attempts at therapy.  He denies other ingestants, alcohol use. Per EMS report the patient's girlfriend gave him intramuscular Narcan, and by the time of EMS arrival the patient was awake, alert, ambulatory.  Vital signs notable for mild tachycardia in transport. Past Medical History:  Diagnosis Date  . ADHD (attention deficit hyperactivity disorder)   . Bipolar disorder ([facility])   . Depression   . Drug abuse ([facility])   . Outbursts of anger     Patient Active Problem List   Diagnosis Date Noted  . Hepatitis C virus infection without hepatic coma 07/30/2019  . Hepatitis C antibody positive in blood 07/29/2019  . IVDU (intravenous drug user) 07/29/2019  . Suicidal ideations 07/29/2019  . Bipolar disorder ([facility]) 07/29/2019  . Acute febrile illness 07/28/2019  . Substance induced mood disorder ([facility]) 10/15/2017  . Severe recurrent major depression without psychotic features ([facility]) 10/14/2017    History reviewed. No pertinent surgical history.     History reviewed. No pertinent family history.  Social History   Tobacco Use  . Smoking status: Current Every Day Smoker    Packs/day: 0.50    Types: Cigarettes  . Smokeless tobacco: Never Used  Vaping Use  . Vaping Use: Never used  Substance Use Topics  . Alcohol use: Yes    Comment: occ  . Drug use: Yes    Types: Marijuana, Methamphetamines    Comment: Heroine    Home Medications Prior to Admission medications   Medication Sig Start  Date End Date Taking? Authorizing Provider  doxycycline (VIBRAMYCIN) 100 MG capsule Take 1 capsule (100 mg total) by mouth 2 (two) times daily. Patient not taking: Reported on 04/26/2020 01/29/20   Horton, Mayer Masker, MD  ibuprofen (ADVIL) 600 MG tablet Take 1 tablet (600 mg total) by mouth every 6 (six) hours as needed. Patient not taking: Reported on 04/26/2020 01/16/20   Fayrene Helper, PA-C  penicillin v potassium (VEETID) 500 MG tablet Take 1 tablet (500 mg total) by mouth 3 (three) times daily. Patient not taking: Reported on 04/26/2020 01/16/20   Fayrene Helper, PA-C    Allergies    Patient has no known allergies.  Review of Systems   Review of Systems  Constitutional:       Per HPI, otherwise negative  HENT:       Per HPI, otherwise negative  Respiratory:       Per HPI, otherwise negative  Cardiovascular:       Per HPI, otherwise negative  Gastrointestinal: Negative for vomiting.  Endocrine:       Negative aside from HPI  Genitourinary:       Neg aside from HPI   Musculoskeletal:       Per HPI, otherwise negative  Skin: Negative.   Neurological: Negative for syncope.    Physical Exam Updated Vital Signs BP (!) 99/54   Pulse 92   Temp 100.2 F (37.9 C) (Oral)   Resp 11   Ht 5\' 8"  (1.727 m)  Wt 72.6 kg   SpO2 94%   BMI 24.33 kg/m   Physical Exam Vitals and nursing note reviewed.  Constitutional:      General: He is not in acute distress.    Appearance: He is well-developed.  HENT:     Head: Normocephalic and atraumatic.  Eyes:     Conjunctiva/sclera: Conjunctivae normal.  Cardiovascular:     Rate and Rhythm: Normal rate and regular rhythm.  Pulmonary:     Effort: Pulmonary effort is normal. No respiratory distress.     Breath sounds: No stridor.  Abdominal:     General: There is no distension.  Skin:    General: Skin is warm and dry.     Comments: No appreciable lesions on either AC area aside from red punctate puncture mark right side.  Neurological:      Mental Status: He is alert and oriented to person, place, and time.     Comments: Sleepy, but oriented x3, moves all extremities spontaneously, speech is clear, face is symmetric.  Psychiatric:        Mood and Affect: Affect is flat.        Thought Content: Thought content does not include suicidal ideation.      ED Results / Procedures / Treatments    Procedures Procedures (including critical care time)  ED Course  I have reviewed the triage vital signs and the nursing notes.  Pertinent labs & imaging results that were available during my care of the patient were reviewed by me and considered in my medical decision making (see chart for details).    MDM Rules/Calculators/A&P                          11:06 AM Patient awake, alert, has remained so, with no notable changes on hemodynamic monitoring for several hours after his overdose. We discussed today's evaluation, offered assistance with rehabilitation, additional Narcan, patient defers both of these. The patient presents after overdose earlier today, with no ongoing complaints, no suicidal ideation, and after no decompensation, as above, he was brought here, and is now appropriate for discharge with outpatient follow-up.  MDM Number of Diagnoses or Management Options Accidental overdose of heroin, initial encounter [facility]): new, needed workup   Amount and/or Complexity of Data Reviewed Decide to obtain previous medical records or to obtain history from someone other than the patient: yes Obtain history from someone other than the patient: yes Review and summarize past medical records: yes  Risk of Complications, Morbidity, and/or Mortality Presenting problems: high Diagnostic procedures: moderate Management options: high  Critical Care Total time providing critical care: < 30 minutes  Patient Progress Patient progress: improved  Final Clinical Impression(s) / ED Diagnoses Final diagnoses:  Accidental overdose of  heroin, initial encounter [facility])    Rx / DC Orders ED Discharge Orders    None       Gerhard Munch, MD 04/26/20 1108

## 2020-04-26 NOTE — ED Notes (Signed)
Patient BIB EMS; Pt stated he did use IV heroin; pts girlfriend panicked and gave pt IM narcan and called 911. EMS stated that pt was axo x4 and ambulatory on site.  BP-134/90 HR-102 O2-96%

## 2020-05-01 ENCOUNTER — Emergency Department (HOSPITAL_COMMUNITY)
Admission: EM | Admit: 2020-05-01 | Discharge: 2020-05-01 | Disposition: A | Discharge status: Home / Self Care | Payer: Self-pay | Attending: Emergency Medicine | Admitting: Emergency Medicine

## 2020-05-01 ENCOUNTER — Encounter (HOSPITAL_COMMUNITY): Payer: Self-pay | Admitting: *Deleted

## 2020-05-01 ENCOUNTER — Other Ambulatory Visit: Payer: Self-pay

## 2020-05-01 DIAGNOSIS — I471 Supraventricular tachycardia: Secondary | ICD-10-CM | POA: Insufficient documentation

## 2020-05-01 DIAGNOSIS — T401X1A Poisoning by heroin, accidental (unintentional), initial encounter: Secondary | ICD-10-CM | POA: Insufficient documentation

## 2020-05-01 DIAGNOSIS — F17210 Nicotine dependence, cigarettes, uncomplicated: Secondary | ICD-10-CM | POA: Insufficient documentation

## 2020-05-01 MED ORDER — ADENOSINE 6 MG/2ML IV SOLN
INTRAVENOUS | Status: AC
  Administered 2020-05-01: 6 mg
  Filled 2020-05-01: qty 6

## 2020-05-01 MED ORDER — NALOXONE HCL 4 MG/0.1ML NA LIQD: 1.0000 | NASAL | Status: DC | PRN

## 2020-05-01 NOTE — ED Provider Notes (Signed)
[facility] EMERGENCY DEPARTMENT Provider Note   CSN: [id number] Arrival date & time: 05/01/20  1001     History Chief Complaint  Patient presents with  . Tachycardia  . Drug Overdose    Caleb Ellis is a 29 y.o. male.  Patient with hx polysubstance abuse, s/p accidental overdose of heroin. Was in local motel, friend noted pt unresponsive post ivda, called EMS. Symptoms acute onset, severe, constant, persistent up until EMS giving narcan. EMS found with poor resp effort, unresponsive, and gave narcan with improved alertness and breathing. Pt with recent ED visit for similar symptoms. Denies trying to harm self, denies depression. EMS notes narrow complex tachy with hr in 200s once they hooked him up to monitor. Patient denies sob or chest pain but states feels very shaky/anxious. EMS notes bp normal en route.   The history is provided by the patient.       Past Medical History:  Diagnosis Date  . ADHD (attention deficit hyperactivity disorder)   . Bipolar disorder ([place])   . Depression   . Drug abuse ([place])   . Outbursts of anger     Patient Active Problem List   Diagnosis Date Noted  . Hepatitis C virus infection without hepatic coma 07/30/2019  . Hepatitis C antibody positive in blood 07/29/2019  . IVDU (intravenous drug user) 07/29/2019  . Suicidal ideations 07/29/2019  . Bipolar disorder ([place]) 07/29/2019  . Acute febrile illness 07/28/2019  . Substance induced mood disorder ([place]) 10/15/2017  . Severe recurrent major depression without psychotic features ([place]) 10/14/2017    History reviewed. No pertinent surgical history.     History reviewed. No pertinent family history.  Social History   Tobacco Use  . Smoking status: Current Every Day Smoker    Packs/day: 0.50    Types: Cigarettes  . Smokeless tobacco: Never Used  Vaping Use  . Vaping Use: Never used  Substance Use Topics  . Alcohol use: Yes    Comment: occ  . Drug use: Yes     Frequency: 7.0 times per week    Types: Marijuana, Methamphetamines    Comment: Heroine Fentanyl    Home Medications Prior to Admission medications   Medication Sig Start Date End Date Taking? Authorizing Provider  doxycycline (VIBRAMYCIN) 100 MG capsule Take 1 capsule (100 mg total) by mouth 2 (two) times daily. Patient not taking: Reported on 04/26/2020 01/29/20   Horton, Barbette Hair, MD  ibuprofen (ADVIL) 600 MG tablet Take 1 tablet (600 mg total) by mouth every 6 (six) hours as needed. Patient not taking: Reported on 04/26/2020 01/16/20   Domenic Moras, PA-C  penicillin v potassium (VEETID) 500 MG tablet Take 1 tablet (500 mg total) by mouth 3 (three) times daily. Patient not taking: Reported on 04/26/2020 01/16/20   Domenic Moras, PA-C    Allergies    Patient has no known allergies.  Review of Systems   Review of Systems  Constitutional: Negative for fever.  HENT: Negative for sore throat.   Eyes: Negative for visual disturbance.  Respiratory: Negative for cough and shortness of breath.   Cardiovascular: Negative for chest pain.  Gastrointestinal: Negative for abdominal pain and vomiting.  Genitourinary: Negative for flank pain.  Musculoskeletal: Negative for back pain.  Skin: Negative for rash.  Neurological: Negative for headaches.  Hematological: Does not bruise/bleed easily.  Psychiatric/Behavioral: Negative for suicidal ideas.    Physical Exam Updated Vital Signs BP (!) 114/93 (BP Location: Left Arm)   Pulse (!) 113  Temp 98.5 F (36.9 C) (Temporal)   Resp 18   Ht 1.727 m (5\' 8" )   Wt 68 kg   SpO2 100%   BMI 22.81 kg/m   Physical Exam Vitals and nursing note reviewed.  Constitutional:      Appearance: He is well-developed.     Comments: Tachycardic, anxious.   HENT:     Head: Atraumatic.     Nose: Nose normal.     Mouth/Throat:     Mouth: Mucous membranes are moist.     Pharynx: Oropharynx is clear.  Eyes:     General: No scleral icterus.     Conjunctiva/sclera: Conjunctivae normal.  Neck:     Trachea: No tracheal deviation.  Cardiovascular:     Rate and Rhythm: Regular rhythm. Tachycardia present.     Pulses: Normal pulses.     Heart sounds: Normal heart sounds. No murmur heard.  No friction rub. No gallop.   Pulmonary:     Effort: Pulmonary effort is normal. No accessory muscle usage or respiratory distress.     Breath sounds: Normal breath sounds.  Abdominal:     General: Bowel sounds are normal. There is no distension.     Palpations: Abdomen is soft.     Tenderness: There is no abdominal tenderness. There is no guarding.  Genitourinary:    Comments: No cva tenderness. Musculoskeletal:        General: No swelling.     Cervical back: Normal range of motion and neck supple. No rigidity.  Skin:    General: Skin is warm and dry.     Findings: No rash.  Neurological:     Mental Status: He is alert.     Comments: Alert, speech clear.   Psychiatric:     Comments: Anxious appearing.      ED Results / Procedures / Treatments   Labs (all labs ordered are listed, but only abnormal results are displayed) Labs Reviewed - No data to display  EKG None  Radiology No results found.  Procedures Procedures (including critical care time)  Medications Ordered in ED Medications  naloxone (NARCAN) nasal spray 4 mg/0.1 mL (has no administration in time range)  adenosine (ADENOCARD) 6 MG/2ML injection (6 mg  Given 05/01/20 1019)    ED Course  I have reviewed the triage vital signs and the nursing notes.  Pertinent labs & imaging results that were available during my care of the patient were reviewed by me and considered in my medical decision making (see chart for details).    MDM Rules/Calculators/A&P                         No change in rhythm with vagal maneuvers.   Iv ns. Continuous pulse ox and monitor. o2 Brouss[place]. Cardioversion pads applied. Adenosine 6 mg rapid IVP.  Post adenosine, conversion to sinus rhythm.     Peer support consult made. Will also provide resource guide.   Counseled patient about avoiding drug abuse/IVDA and dangers associated, including death. Encourage to use rehab/treatment resources provided.   Will also provide narcan kit for home use in event accidental overdose.   Observe in ED.  Po fluids, food. Monitoring.   Recheck pt - fully awake and alert. No c/o, no chest pain, no sob. No recurrent respiratory or mental status depression, including ~ 3 hrs post narcan from EMS.   Pt currently appears stable for d/c.   Return precautions provided.    Final  Clinical Impression(s) / ED Diagnoses Final diagnoses:  Accidental overdose of heroin, initial encounter [facility])  SVT (supraventricular tachycardia) ([place])    Rx / DC Orders ED Discharge Orders    None       Lajean Saver, MD 05/01/20 1214

## 2020-05-01 NOTE — Discharge Instructions (Signed)
It was our pleasure to provide your ER care today - we hope that you feel better.  Avoid drug use - accidental overdose as occurred today and last week can be fatal. Follow up with NA, and use resource guide provided for additional community treatment programs.   In event of accidental overdose, use narcan kit and call 911.   Follow up with primary care doctor in the next 1-2 weeks.  Return to ER if worse, new symptoms, fevers, trouble breathing, persistent fast heart beat, chest pain, fainting, or other concern.

## 2020-05-01 NOTE — ED Triage Notes (Signed)
Patient presents to ED via [facility] states they were called out for possible overdose, states upon arrival patient was unresp. Not breathing, gave patient nasal narcan and patient became alert , upon arrival heart rate 248 patient is alert very anxious. States he had used Meth earlier today and injected Fentanyl, states he uses everyday.

## 2020-06-09 ENCOUNTER — Encounter (HOSPITAL_COMMUNITY): Payer: Self-pay | Admitting: Emergency Medicine

## 2020-06-09 ENCOUNTER — Emergency Department (HOSPITAL_COMMUNITY)
Admission: EM | Admit: 2020-06-09 | Discharge: 2020-06-09 | Disposition: A | Discharge status: Home / Self Care | Payer: Self-pay | Attending: Emergency Medicine | Admitting: Emergency Medicine

## 2020-06-09 DIAGNOSIS — Z5321 Procedure and treatment not carried out due to patient leaving prior to being seen by health care provider: Secondary | ICD-10-CM | POA: Insufficient documentation

## 2020-06-09 DIAGNOSIS — F1190 Opioid use, unspecified, uncomplicated: Secondary | ICD-10-CM | POA: Insufficient documentation

## 2020-06-09 DIAGNOSIS — R509 Fever, unspecified: Secondary | ICD-10-CM | POA: Insufficient documentation

## 2020-06-09 NOTE — ED Triage Notes (Signed)
Per EMS- patient c/o fever. Patient states he last used heroin at 0200 today in his right arm.  Tylenol 1000 mg given prior to arrival to the ED. Temp decreased to 98.9.

## 2020-06-30 ENCOUNTER — Encounter (HOSPITAL_COMMUNITY): Payer: Self-pay

## 2020-06-30 ENCOUNTER — Emergency Department (HOSPITAL_COMMUNITY)
Admission: EM | Admit: 2020-06-30 | Discharge: 2020-06-30 | Disposition: A | Discharge status: Home / Self Care | Payer: Self-pay | Attending: Emergency Medicine | Admitting: Emergency Medicine

## 2020-06-30 DIAGNOSIS — T50901A Poisoning by unspecified drugs, medicaments and biological substances, accidental (unintentional), initial encounter: Secondary | ICD-10-CM | POA: Insufficient documentation

## 2020-06-30 DIAGNOSIS — F17210 Nicotine dependence, cigarettes, uncomplicated: Secondary | ICD-10-CM | POA: Insufficient documentation

## 2020-06-30 MED ORDER — NALOXONE HCL 4 MG/0.1ML NA LIQD
NASAL | 0 refills | Status: DC
Start: 2020-06-30 — End: 2024-04-16

## 2020-06-30 NOTE — ED Triage Notes (Signed)
Pt was found by his girlfriend [place] [employer] in an abandoned building, pt has hs of multiple overdoses and states that he doesn't remember what he bought but it was probably heroin. His girlfriend gave him 1.2mg  of narcan IM and then was bagged by EMS Pt is A&O currently ans complains of being cold

## 2020-06-30 NOTE — Discharge Instructions (Signed)
You have been given additional Narcan prescription  I have sent you home with resources for addiction.

## 2020-06-30 NOTE — ED Notes (Signed)
Pt refusing blood work and intravenous access.

## 2020-06-30 NOTE — ED Provider Notes (Signed)
[facility] DEPT Provider Note   CSN: [id number] Arrival date & time: 06/30/20  2002     History Overdose  Caleb Ellis is a 29 y.o. male with past medical history significant for hepatitis C, polysubstance use who presents for evaluation of possible overdose.  Patient found by his girlfriend behind [employer] and abandoned building.  Patient has history of multiple unintentional overdoses from heroin.  Significant other gave him 1.2 mg of Narcan IM and then patient was bagged by EMS.  On arrival to the emergency department patient ANO and denies any complaints.   Patient states he does inject heroin into his hands.  States he uses "multiple times a day."  He is currently on house arrest however states he is homeless.  States "I wish to please forgive me nonsustained."  He denies headache, lightheadedness, dizziness, chest pain, shortness of breath, abdominal pain, diarrhea, dysuria, rashes or lesions.  Denies additional aggravating or alleviating factors. Denies SI, HI, AVH.  History obtained from patient and past medical records.  No interpreter used.  HPI     Past Medical History:  Diagnosis Date  . ADHD (attention deficit hyperactivity disorder)   . Bipolar disorder ([place])   . Depression   . Drug abuse ([place])   . Outbursts of anger     Patient Active Problem List   Diagnosis Date Noted  . Hepatitis C virus infection without hepatic coma 07/30/2019  . Hepatitis C antibody positive in blood 07/29/2019  . IVDU (intravenous drug user) 07/29/2019  . Suicidal ideations 07/29/2019  . Bipolar disorder ([place]) 07/29/2019  . Acute febrile illness 07/28/2019  . Substance induced mood disorder ([place] [address]) 10/15/2017  . Severe recurrent major depression without psychotic features ([place]) 10/14/2017    History reviewed. No pertinent surgical history.     History reviewed. No pertinent family history.  Social History   Tobacco Use  . Smoking status:  Current Every Day Smoker    Packs/day: 0.50    Types: Cigarettes  . Smokeless tobacco: Never Used  Vaping Use  . Vaping Use: Never used  Substance Use Topics  . Alcohol use: Yes    Comment: occ  . Drug use: Yes    Frequency: 7.0 times per week    Types: Marijuana, Methamphetamines    Comment: Heroine Fentanyl    Home Medications Prior to Admission medications   Medication Sig Start Date End Date Taking? Authorizing Provider  doxycycline (VIBRAMYCIN) 100 MG capsule Take 1 capsule (100 mg total) by mouth 2 (two) times daily. Patient not taking: Reported on 04/26/2020 01/29/20   Horton, Barbette Hair, MD  ibuprofen (ADVIL) 600 MG tablet Take 1 tablet (600 mg total) by mouth every 6 (six) hours as needed. Patient not taking: Reported on 04/26/2020 01/16/20   Domenic Moras, PA-C  naloxone [facility]) nasal spray 4 mg/0.1 mL Use for overdose 06/30/20   Exander Shaul A, PA-C  penicillin v potassium (VEETID) 500 MG tablet Take 1 tablet (500 mg total) by mouth 3 (three) times daily. Patient not taking: Reported on 04/26/2020 01/16/20   Domenic Moras, PA-C    Allergies    Patient has no known allergies.  Review of Systems   Review of Systems  Constitutional: Negative.   HENT: Negative.   Respiratory: Negative.   Cardiovascular: Negative.   Gastrointestinal: Negative.   Genitourinary: Negative.   Musculoskeletal: Negative.   Skin: Negative.   Neurological: Negative.   All other systems reviewed and are negative.   Physical Exam  Updated Vital Signs BP 104/63 (BP Location: Left Arm)   Pulse 90   Temp 98.2 F (36.8 C) (Oral)   Resp 10   SpO2 96%   Physical Exam Vitals and nursing note reviewed.  Constitutional:      General: He is not in acute distress.    Appearance: He is well-developed. He is not ill-appearing, toxic-appearing or diaphoretic.  HENT:     Head: Normocephalic and atraumatic.     Nose: Nose normal.     Mouth/Throat:     Mouth: Mucous membranes are moist.  Eyes:      Pupils: Pupils are equal, round, and reactive to light.  Cardiovascular:     Rate and Rhythm: Normal rate and regular rhythm.     Pulses: Normal pulses.     Heart sounds: Normal heart sounds.  Pulmonary:     Effort: Pulmonary effort is normal. No respiratory distress.     Breath sounds: Normal breath sounds.  Abdominal:     General: Bowel sounds are normal. There is no distension.     Palpations: Abdomen is soft.  Musculoskeletal:        General: Normal range of motion.     Cervical back: Normal range of motion and neck supple.     Comments: Moves all 4 extremities at difficulty.  Compartments soft  Skin:    General: Skin is warm and dry.     Comments: Track marks to bilateral upper extremities.  No fluctuance, induration, erythema or warmth.  Neurological:     Mental Status: He is alert.     Comments: Arouses to voice.  Cranial nerves II through XII grossly intact.  Moves all 4 extremities.    ED Results / Procedures / Treatments   Labs (all labs ordered are listed, but only abnormal results are displayed) Labs Reviewed - No data to display  EKG None  Radiology No results found.  Procedures Procedures (including critical care time)  Medications Ordered in ED Medications - No data to display  ED Course  I have reviewed the triage vital signs and the nursing notes.  Pertinent labs & imaging results that were available during my care of the patient were reviewed by me and considered in my medical decision making (see chart for details).  29 year old presents for a possible unintentional overdose.  Found by significant other [place] a [place].  Had Narcan administered by significant other with return to spontaneous respirations with EMS.  On arrival patient has no complaints.  His heart and lungs are clear.  He does not any evidence of murmur.  His abdomen is soft, nontender.  He has a nonfocal neuro exam without deficits.  Patient does have multiple evidence of track  marks to his upper extremities however they do not appear actively infected.  No erythema, warmth, fluctuance or induration.  Patient does admit to using heroin "to have fun."  He denies any intent at self-harm.  Denies SI, HI, AVH.  Patient has been observed here in the emergency department for >3 hours port narcan by EMS.  He did does not have any respiratory distress and appears alert and oriented.  Ambulatory without difficulty.  He did not require any additional Narcan here in the emergency department.  Patient denies any complaints.  Patient given resources for outpatient substance abuse.  Encouraged rehab and treatment.  Provided with Narcan kit for home use and event of accidental overdose.  Return precautions discussed.. Stable for discharge  The patient has been  appropriately medically screened and/or stabilized in the ED. I have low suspicion for any other emergent medical condition which would require further screening, evaluation or treatment in the ED or require inpatient management. Continues to denies SI, HI, AVH.  Patient is hemodynamically stable and in no acute distress.  Patient able to ambulate in department prior to ED.  Evaluation does not show acute pathology that would require ongoing or additional emergent interventions while in the emergency department or further inpatient treatment.  I have discussed the diagnosis with the patient and answered all questions.  Pain is been managed while in the emergency department and patient has no further complaints prior to discharge.  Patient is comfortable with plan discussed in room and is stable for discharge at this time.  I have discussed strict return precautions for returning to the emergency department.  Patient was encouraged to follow-up with PCP/specialist refer to at discharge.    MDM Rules/Calculators/A&P                           Final Clinical Impression(s) / ED Diagnoses Final diagnoses:  Accidental drug overdose,  initial encounter    Rx / DC Orders ED Discharge Orders         Ordered    naloxone [facility]) nasal spray 4 mg/0.1 mL        06/30/20 2301           Anslee Micheletti A, PA-C 06/30/20 2305    Malvin Johns, MD 06/30/20 2314

## 2020-08-22 ENCOUNTER — Other Ambulatory Visit: Payer: Self-pay

## 2020-08-22 ENCOUNTER — Ambulatory Visit (HOSPITAL_COMMUNITY)
Admission: EM | Admit: 2020-08-22 | Discharge: 2020-08-22 | Disposition: A | Discharge status: Home / Self Care | Payer: No Payment, Other | Attending: Psychiatry | Admitting: Psychiatry

## 2020-08-22 DIAGNOSIS — F1910 Other psychoactive substance abuse, uncomplicated: Secondary | ICD-10-CM | POA: Diagnosis not present

## 2020-08-22 NOTE — ED Notes (Addendum)
Pt discharged in no acute distress. Verbalized understanding of all instructions reviewed on AVS by RN. All belongings returned to pt intact from locker #29. Pt escorted to lobby to self care. Denies SI/HI. Safety maintained.
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
## 2020-08-22 NOTE — ED Provider Notes (Signed)
Behavioral Health Urgent Care Medical Screening Exam  Patient Name: Caleb Ellis MRN: [id number] Date of Evaluation: 08/22/20 Chief Complaint: Chief Complaint/Presenting Problem: NA Diagnosis:  Final diagnoses:  Polysubstance abuse (HCC)    History of Present illness: Caleb Ellis is a 29 y.o. male.  Presents to behavioral health urgent care for suicidal ideations.  During this assessment he denied suicidal or homicidal ideations.  Denies auditory or visual hallucinations.  Patient appeared intoxicated and slightly disorganized however redirectable. "  I am not sure why I came?,  I would like to get back to my mom's house so I can eat."  Patient reported " I feel stupid crying over a girl." Reports last use alcohol and drugs on yesterday 08/21/2020.  Patient was offered additional outpatient resources for substance abuse.  Patient adamantly denied inpatient  Admission. Caleb Ellis is requesting to be discharged.  Counselor to provide additional outpatient resources.  Support, encouragement and reassurance was provided.   Psychiatric Specialty Exam  Presentation  General Appearance:Disheveled  Eye Contact:Minimal  Speech:Clear and Coherent;Pressured  Speech Volume:Normal  Handedness:Right   Mood and Affect  Mood:Anxious;Labile  Affect:Labile   Thought Process  Thought Processes:Coherent;Linear  Descriptions of Associations:Loose  Orientation:Full (Time, Place and Person)  Thought Content:Scattered  Hallucinations:None  Ideas of Reference:None  Suicidal Thoughts:No  Homicidal Thoughts:No   Sensorium  Memory:Immediate Fair;Recent Fair;Remote Fair  Judgment:Poor  Insight:Fair   Executive Functions  Concentration:Fair  Attention Span:Fair  Recall:Fair  Fund of Knowledge:Fair  Language:Fair   Psychomotor Activity  Psychomotor Activity:Normal   Assets  Assets:Social Support   Sleep  Sleep:Fair  Number of hours: No data recorded  Physical  Exam: Physical Exam ROS Blood pressure (!) 122/91, pulse (!) 101, temperature 98.6 F (37 C), temperature source Oral, resp. rate 18, height 5\' 8"  (1.727 m), weight 131 lb (59.4 kg), SpO2 97 %. Body mass index is 19.92 kg/m.  Musculoskeletal: Strength & Muscle Tone: within normal limits Gait & Station: normal Patient leans: N/A   [facility] MSE Discharge Disposition for Follow up and Recommendations: Based on my evaluation the patient does not appear to have an emergency medical condition and can be discharged with resources and follow up care in outpatient services for Substance Abuse Intensive Outpatient Program   , NP 08/22/2020, 5:32 PM
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
## 2020-08-22 NOTE — ED Notes (Signed)
Patient belongings in locker 29 

## 2020-08-22 NOTE — Discharge Instructions (Signed)
Take all medications as prescribed. Keep all follow-up appointments as scheduled.  Do not consume alcohol or use illegal drugs while on prescription medications. Report any adverse effects from your medications to your primary care provider promptly.  In the event of recurrent symptoms or worsening symptoms, call 911, a crisis hotline, or go to the nearest emergency department for evaluation.   

## 2022-07-12 ENCOUNTER — Other Ambulatory Visit: Payer: Self-pay

## 2022-07-12 ENCOUNTER — Emergency Department (HOSPITAL_COMMUNITY)
Admission: EM | Admit: 2022-07-12 | Discharge: 2022-07-12 | Disposition: A | Discharge status: Home / Self Care | Payer: Self-pay | Attending: Emergency Medicine | Admitting: Emergency Medicine

## 2022-07-12 ENCOUNTER — Encounter (HOSPITAL_COMMUNITY): Payer: Self-pay | Admitting: Emergency Medicine

## 2022-07-12 ENCOUNTER — Emergency Department (HOSPITAL_COMMUNITY): Payer: Self-pay

## 2022-07-12 DIAGNOSIS — R200 Anesthesia of skin: Secondary | ICD-10-CM | POA: Insufficient documentation

## 2022-07-12 DIAGNOSIS — R0781 Pleurodynia: Secondary | ICD-10-CM | POA: Insufficient documentation

## 2022-07-12 DIAGNOSIS — H5789 Other specified disorders of eye and adnexa: Secondary | ICD-10-CM | POA: Insufficient documentation

## 2022-07-12 MED ORDER — KETOROLAC TROMETHAMINE 30 MG/ML IJ SOLN
30.0000 mg | Freq: Once | INTRAMUSCULAR | Status: AC
  Administered 2022-07-12: 30 mg via INTRAMUSCULAR
  Filled 2022-07-12: qty 1
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
## 2022-07-12 NOTE — Discharge Instructions (Signed)
Please return with shortness of breath or worsening pain
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
## 2022-09-24 ENCOUNTER — Other Ambulatory Visit: Payer: Self-pay

## 2022-09-24 ENCOUNTER — Emergency Department (HOSPITAL_COMMUNITY)
Admission: EM | Admit: 2022-09-24 | Discharge: 2022-09-24 | Disposition: A | Discharge status: Home / Self Care | Payer: Self-pay | Attending: Emergency Medicine | Admitting: Emergency Medicine

## 2022-09-24 DIAGNOSIS — T401X1A Poisoning by heroin, accidental (unintentional), initial encounter: Secondary | ICD-10-CM | POA: Insufficient documentation

## 2022-09-24 LAB — URINALYSIS, ROUTINE W REFLEX MICROSCOPIC
Bilirubin Urine: NEGATIVE
Glucose, UA: 50 mg/dL — AB
Hgb urine dipstick: NEGATIVE
Ketones, ur: NEGATIVE mg/dL
Leukocytes,Ua: NEGATIVE
Nitrite: NEGATIVE
Protein, ur: 100 mg/dL — AB
Specific Gravity, Urine: 1.019 (ref 1.005–1.030)
pH: 6 (ref 5.0–8.0)

## 2022-09-24 LAB — RAPID URINE DRUG SCREEN, HOSP PERFORMED
Amphetamines: POSITIVE — AB
Barbiturates: NOT DETECTED
Benzodiazepines: NOT DETECTED
Cocaine: NOT DETECTED
Opiates: NOT DETECTED
Tetrahydrocannabinol: POSITIVE — AB

## 2022-09-24 NOTE — ED Triage Notes (Signed)
BIB EMS from [place]  unresponsive, OD Heroin, admitted to snorting, pt was given 2 mg IN Narcan, Now alert and oriented. 150/92-107-17-40% ETC02 40  CBG 148

## 2022-09-24 NOTE — Discharge Instructions (Signed)
Return for any problem.  ?

## 2022-09-24 NOTE — ED Provider Notes (Signed)
[facility]-EMERGENCY DEPT Provider Note   CSN: [id number] Arrival date & time: 09/24/22  1520     History  Chief Complaint  Patient presents with   Drug Overdose    Caleb Ellis is a 31 y.o. male.  31 year old male with prior medical history as detailed below presents for evaluation.  Patient brought in by EMS from [facility].  Patient was unresponsive at [facility].  Patient reportedly had just snorted some heroin.  He denies use of other substances today.  Patient was given 2 mg of intranasal Narcan.  Patient is alert and comfortable on evaluation.  He is without complaint.  He admits that he uses methamphetamine most of the time, today he had the opportunity to use some heroin and decided to "go for it."  The history is provided by the patient and medical records.       Home Medications Prior to Admission medications   Medication Sig Start Date End Date Taking? Authorizing Provider  doxycycline (VIBRAMYCIN) 100 MG capsule Take 1 capsule (100 mg total) by mouth 2 (two) times daily. Patient not taking: Reported on 04/26/2020 01/29/20   Horton, Mayer Masker, MD  ibuprofen (ADVIL) 600 MG tablet Take 1 tablet (600 mg total) by mouth every 6 (six) hours as needed. Patient not taking: Reported on 04/26/2020 01/16/20   Fayrene Helper, PA-C  naloxone [facility]) nasal spray 4 mg/0.1 mL Use for overdose 06/30/20   Henderly, Britni A, PA-C  penicillin v potassium (VEETID) 500 MG tablet Take 1 tablet (500 mg total) by mouth 3 (three) times daily. Patient not taking: Reported on 04/26/2020 01/16/20   Fayrene Helper, PA-C      Allergies    Patient has no known allergies.    Review of Systems   Review of Systems  All other systems reviewed and are negative.   Physical Exam Updated Vital Signs BP (!) 124/90 (BP Location: Left Arm)   Pulse (!) 103   Temp 97.9 F (36.6 C) (Oral)   Resp 14   SpO2 95%  Physical Exam Vitals and nursing note reviewed.  Constitutional:       General: He is not in acute distress.    Appearance: Normal appearance. He is well-developed.  HENT:     Head: Normocephalic and atraumatic.  Eyes:     Conjunctiva/sclera: Conjunctivae normal.     Pupils: Pupils are equal, round, and reactive to light.  Cardiovascular:     Rate and Rhythm: Normal rate and regular rhythm.     Heart sounds: Normal heart sounds.  Pulmonary:     Effort: Pulmonary effort is normal. No respiratory distress.     Breath sounds: Normal breath sounds.  Abdominal:     General: There is no distension.     Palpations: Abdomen is soft.     Tenderness: There is no abdominal tenderness.  Musculoskeletal:        General: No deformity. Normal range of motion.     Cervical back: Normal range of motion and neck supple.  Skin:    General: Skin is warm and dry.  Neurological:     General: No focal deficit present.     Mental Status: He is alert and oriented to person, place, and time.     ED Results / Procedures / Treatments   Labs (all labs ordered are listed, but only abnormal results are displayed) Labs Reviewed - No data to display  EKG None  Radiology No results found.  Procedures Procedures  Medications Ordered in ED Medications - No data to display  ED Course/ Medical Decision Making/ A&P                           Medical Decision Making Amount and/or Complexity of Data Reviewed Labs: ordered.    Medical Screen Complete  This patient presented to the ED with complaint of heroin OD.  This complaint involves an extensive number of treatment options. The initial differential diagnosis includes, but is not limited to, acute effect of Heroin OD  This presentation is: Acute, Self-Limited, Previously Undiagnosed, Uncertain Prognosis, Complicated, Systemic Symptoms, and Threat to Life/Bodily Function   Patient with well-documented history of polysubstance abuse presents after reported heroin overdose.  Patient reports snorting heroin  earlier today.  He was found unresponsive by EMS.  EMS administered 2 mg intranasal Narcan.    Patient is comfortable and without complaint on evaluation.  Patient observed.  Patient without need for additional Narcan or other intervention.  At time of discharge the patient is comfortable and conversant.  He is without complaint.  He is aware of need to be very cautious with using illicit drugs in the future.  Importance of close follow-up is stressed.  Strict return precautions given and understood.     Additional history obtained:  External records from outside sources obtained and reviewed including prior ED visits and prior Inpatient records.   Cardiac Monitoring:  The patient was maintained on a cardiac monitor.  I personally viewed and interpreted the cardiac monitor which showed an underlying rhythm of: NSR Problem List / ED Course:  Heroin Overdose   Reevaluation:  After the interventions noted above, I reevaluated the patient and found that they have: improved  Disposition:  After consideration of the diagnostic results and the patients response to treatment, I feel that the patent would benefit from close outpatient follow-up.           Final Clinical Impression(s) / ED Diagnoses Final diagnoses:  Accidental overdose of heroin, initial encounter [facility])    Rx / DC Orders ED Discharge Orders     None         Wynetta Fines, MD 09/24/22 1702

## 2022-10-21 ENCOUNTER — Other Ambulatory Visit: Payer: Self-pay

## 2022-10-21 ENCOUNTER — Ambulatory Visit (INDEPENDENT_AMBULATORY_CARE_PROVIDER_SITE_OTHER): Admitting: Internal Medicine

## 2022-10-21 ENCOUNTER — Encounter: Payer: Self-pay | Admitting: Internal Medicine

## 2022-10-21 VITALS — BP 108/76 | HR 71 | Resp 16 | Ht 68.0 in | Wt 148.0 lb

## 2022-10-21 DIAGNOSIS — B182 Chronic viral hepatitis C: Secondary | ICD-10-CM
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
## 2022-10-21 NOTE — Addendum Note (Signed)
Addended by: Caffie Pinto on: 10/21/2022 10:25 AM   Modules accepted: Orders

## 2022-12-13 ENCOUNTER — Ambulatory Visit: Payer: Self-pay | Admitting: Internal Medicine

## 2023-12-21 ENCOUNTER — Emergency Department (HOSPITAL_COMMUNITY)
Admission: EM | Admit: 2023-12-21 | Discharge: 2023-12-21 | Discharge status: Against Medical Advice | Payer: Self-pay | Attending: Emergency Medicine | Admitting: Emergency Medicine

## 2023-12-21 ENCOUNTER — Other Ambulatory Visit: Payer: Self-pay

## 2023-12-21 ENCOUNTER — Encounter (HOSPITAL_COMMUNITY): Payer: Self-pay

## 2023-12-21 DIAGNOSIS — T40412A Poisoning by fentanyl or fentanyl analogs, intentional self-harm, initial encounter: Secondary | ICD-10-CM | POA: Insufficient documentation

## 2023-12-21 DIAGNOSIS — T401X1A Poisoning by heroin, accidental (unintentional), initial encounter: Secondary | ICD-10-CM

## 2023-12-21 DIAGNOSIS — Z5329 Procedure and treatment not carried out because of patient's decision for other reasons: Secondary | ICD-10-CM | POA: Insufficient documentation
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
## 2023-12-21 NOTE — ED Notes (Signed)
 Patient walked out of room, saying "I been here over 2 hours I am leaving." Patient ripped his IV out. Patient educated on the dangers of leaving AMA. Dr. Freida Busman notified.

## 2023-12-21 NOTE — ED Provider Notes (Signed)
 [place] EMERGENCY DEPARTMENT AT [facility] Provider Note   CSN: [id number] Arrival date & time: 12/21/23  1559     History  Chief Complaint  Patient presents with   Drug Overdose    Caleb Ellis is a 33 y.o. male.  33 year old male here after an intentional dose of fentanyl.  States he took 2 mg of fentanyl prior to arrival.  States that this was not a suicide attempt.  CT scan patient is friend at home was Gave patient Narcan finished at around with slight relief.  Patient denies any other coingestions at this time.  EMS was called and patient given additional 2 mg of Narcan.  Blood sugar was above 100.  She is really not patient now back to his baseline.       Home Medications Prior to Admission medications   Medication Sig Start Date End Date Taking? Authorizing Provider  buprenorphine (SUBUTEX) 8 MG SUBL SL tablet Place 16 mg under the tongue daily.    [provider]  doxycycline (VIBRAMYCIN) 100 MG capsule Take 1 capsule (100 mg total) by mouth 2 (two) times daily. Patient not taking: Reported on 04/26/2020 01/29/20   Horton, Mayer Masker, MD  ibuprofen (ADVIL) 600 MG tablet Take 1 tablet (600 mg total) by mouth every 6 (six) hours as needed. Patient not taking: Reported on 04/26/2020 01/16/20   Fayrene Helper, PA-C  naloxone [facility]) nasal spray 4 mg/0.1 mL Use for overdose Patient not taking: Reported on 10/21/2022 06/30/20   Henderly, Britni A, PA-C  penicillin v potassium (VEETID) 500 MG tablet Take 1 tablet (500 mg total) by mouth 3 (three) times daily. Patient not taking: Reported on 04/26/2020 01/16/20   Fayrene Helper, PA-C      Allergies    Patient has no known allergies.    Review of Systems   Review of Systems  All other systems reviewed and are negative.   Physical Exam Updated Vital Signs There were no vitals taken for this visit. Physical Exam Vitals and nursing note reviewed.  Constitutional:      General: He is not in acute distress.     Appearance: Normal appearance. He is well-developed. He is not toxic-appearing.  HENT:     Head: Normocephalic and atraumatic.  Eyes:     General: Lids are normal.     Conjunctiva/sclera: Conjunctivae normal.     Pupils: Pupils are equal, round, and reactive to light.  Neck:     Thyroid: No thyroid mass.     Trachea: No tracheal deviation.  Cardiovascular:     Rate and Rhythm: Normal rate and regular rhythm.     Heart sounds: Normal heart sounds. No murmur heard.    No gallop.  Pulmonary:     Effort: Pulmonary effort is normal. No respiratory distress.     Breath sounds: Normal breath sounds. No stridor. No decreased breath sounds, wheezing, rhonchi or rales.  Abdominal:     General: There is no distension.     Palpations: Abdomen is soft.     Tenderness: There is no abdominal tenderness. There is no rebound.  Musculoskeletal:        General: No tenderness. Normal range of motion.     Cervical back: Normal range of motion and neck supple.  Skin:    General: Skin is warm and dry.     Findings: No abrasion or rash.  Neurological:     Mental Status: He is alert and oriented to person, place, and  time. Mental status is at baseline.     GCS: GCS eye subscore is 4. GCS verbal subscore is 5. GCS motor subscore is 6.     Cranial Nerves: No cranial nerve deficit.     Sensory: No sensory deficit.     Motor: Motor function is intact.  Psychiatric:        Attention and Perception: Attention normal.        Speech: Speech normal.        Behavior: Behavior normal.     ED Results / Procedures / Treatments   Labs (all labs ordered are listed, but only abnormal results are displayed) Labs Reviewed - No data to display  EKG None  Radiology No results found.  Procedures Procedures    Medications Ordered in ED Medications - No data to display  ED Course/ Medical Decision Making/ A&P                                 Medical Decision Making  Patient signed out  [facility]        Final Clinical Impression(s) / ED Diagnoses Final diagnoses:  None    Rx / DC Orders ED Discharge Orders     None         Lorre Nick, MD 12/21/23 1832

## 2024-04-02 ENCOUNTER — Encounter (HOSPITAL_BASED_OUTPATIENT_CLINIC_OR_DEPARTMENT_OTHER): Payer: Self-pay | Admitting: Emergency Medicine

## 2024-04-02 ENCOUNTER — Emergency Department (HOSPITAL_COMMUNITY)
Admission: EM | Admit: 2024-04-02 | Discharge: 2024-04-02 | Discharge status: Against Medical Advice | Payer: Self-pay | Attending: Emergency Medicine | Admitting: Emergency Medicine

## 2024-04-02 ENCOUNTER — Emergency Department (HOSPITAL_COMMUNITY): Payer: Self-pay

## 2024-04-02 ENCOUNTER — Encounter (HOSPITAL_COMMUNITY): Payer: Self-pay

## 2024-04-02 ENCOUNTER — Emergency Department (HOSPITAL_BASED_OUTPATIENT_CLINIC_OR_DEPARTMENT_OTHER)
Admission: EM | Admit: 2024-04-02 | Discharge: 2024-04-03 | Disposition: A | Discharge status: Home / Self Care | Payer: Self-pay | Attending: Emergency Medicine | Admitting: Emergency Medicine

## 2024-04-02 ENCOUNTER — Other Ambulatory Visit: Payer: Self-pay

## 2024-04-02 DIAGNOSIS — Z4800 Encounter for change or removal of nonsurgical wound dressing: Secondary | ICD-10-CM | POA: Insufficient documentation

## 2024-04-02 DIAGNOSIS — M7989 Other specified soft tissue disorders: Secondary | ICD-10-CM | POA: Insufficient documentation

## 2024-04-02 DIAGNOSIS — M79662 Pain in left lower leg: Secondary | ICD-10-CM | POA: Insufficient documentation

## 2024-04-02 DIAGNOSIS — Z5321 Procedure and treatment not carried out due to patient leaving prior to being seen by health care provider: Secondary | ICD-10-CM | POA: Insufficient documentation

## 2024-04-02 DIAGNOSIS — L0291 Cutaneous abscess, unspecified: Secondary | ICD-10-CM

## 2024-04-02 DIAGNOSIS — L02416 Cutaneous abscess of left lower limb: Secondary | ICD-10-CM | POA: Insufficient documentation

## 2024-04-02 DIAGNOSIS — F17210 Nicotine dependence, cigarettes, uncomplicated: Secondary | ICD-10-CM | POA: Insufficient documentation

## 2024-04-02 MED ORDER — LIDOCAINE HCL (PF) 1 % IJ SOLN
10.0000 mL | Freq: Once | INTRAMUSCULAR | Status: AC
  Administered 2024-04-02: 10 mL
  Filled 2024-04-02: qty 10

## 2024-04-02 MED ORDER — AMOXICILLIN-POT CLAVULANATE 875-125 MG PO TABS
1.0000 | ORAL_TABLET | Freq: Once | ORAL | Status: AC
  Administered 2024-04-02: 1 via ORAL
  Filled 2024-04-02: qty 1

## 2024-04-02 NOTE — ED Provider Triage Note (Signed)
 Emergency Medicine Provider Triage Evaluation Note  Caleb Ellis , a 33 y.o. male  was evaluated in triage.  Pt complains of left lower extremity redness/swelling/pain.  Reports symptoms have been ongoing for about a week, he does report IV meth use in this area and feels he may have missed.  Denies fever, other substance/alcohol use, no other complaints at this time.  Review of Systems  Positive: As above Negative: As above  Physical Exam  BP (!) 128/108 (BP Location: Right Arm)   Pulse (!) 103   Temp 98.3 F (36.8 C) (Oral)   Resp 18   Ht 5' 8 (1.727 m)   Wt 63.5 kg   SpO2 99%   BMI 21.29 kg/m  Gen:   Awake, no distress   Resp:  Normal effort  MSK:   Moves extremities without difficulty  Other:  Erythema/swelling/warmth with scabbing to medial aspect of left lower extremity, 2+ DP pulse  Medical Decision Making  Medically screening exam initiated at 1:46 PM.  Appropriate orders placed.  Ozzie Knobel was informed that the remainder of the evaluation will be completed by another provider, this initial triage assessment does not replace that evaluation, and the importance of remaining in the ED until their evaluation is complete.     Glendia Rocky SAILOR, [place] 04/02/24 1347

## 2024-04-02 NOTE — ED Triage Notes (Signed)
 Pt presents to the ED via POV with complaints of L calf pain after injecting himself with drugs a week ago. He notes trying to stop and relapsed last week by injecting in the leg and has some visible swelling and redness. Pt is requesting something for infection. A&Ox4 at this time. Denies fevers, chills, CP, dizziness.

## 2024-04-02 NOTE — ED Provider Notes (Signed)
 [facility] Emergency Department Provider Note MRN:  [id number]  Arrival date & time: 04/03/24     Chief Complaint   Leg Pain   History of Present Illness   Caleb Ellis is a 33 y.o. year-old male with a history of substance use disorder presenting to the ED with chief complaint of leg pain.  Abscess to the leg, present for the past few days.  No fever.  Review of Systems  A thorough review of systems was obtained and all systems are negative except as noted in the HPI and PMH.   Patient's Health History    Past Medical History:  Diagnosis Date   ADHD (attention deficit hyperactivity disorder)    Bipolar disorder (HCC)    Depression    Drug abuse (HCC)    Outbursts of anger     History reviewed. No pertinent surgical history.  History reviewed. No pertinent family history.  Social History   Socioeconomic History   Marital status: Single    Spouse name: Not on file   Number of children: Not on file   Years of education: Not on file   Highest education level: Not on file  Occupational History   Not on file  Tobacco Use   Smoking status: Every Day    Current packs/day: 0.50    Types: Cigarettes   Smokeless tobacco: Never  Vaping Use   Vaping status: Never Used  Substance and Sexual Activity   Alcohol use: Yes    Comment: occ   Drug use: Yes    Frequency: 7.0 times per week    Types: Marijuana, Methamphetamines, IV   Sexual activity: Not on file  Other Topics Concern   Not on file  Social History Narrative   Not on file   Social Drivers of Health   Financial Resource Strain: Not on file  Food Insecurity: Not on file  Transportation Needs: Not on file  Physical Activity: Not on file  Stress: Not on file  Social Connections: Not on file  Intimate Partner Violence: Not on file     Physical Exam   Vitals:   04/02/24 2126  BP: (!) 135/94  Pulse: 92  Resp: 18  Temp: 98.2 F (36.8 C)  SpO2: 97%    CONSTITUTIONAL:  Well-appearing, NAD NEURO/PSYCH:  Alert and oriented x 3, no focal deficits EYES:  eyes equal and reactive ENT/NECK:  no LAD, no JVD CARDIO: Regular rate, well-perfused, normal S1 and S2 PULM:  CTAB no wheezing or rhonchi GI/GU:  non-distended, non-tender MSK/SPINE:  No gross deformities, no edema SKIN: Circular area of erythema to the left lower leg with central area of purulence   *Additional and/or pertinent findings included in MDM below  Diagnostic and Interventional Summary    EKG Interpretation Date/Time:    Ventricular Rate:    PR Interval:    QRS Duration:    QT Interval:    QTC Calculation:   R Axis:      Text Interpretation:         Labs Reviewed - No data to display  No orders to display    Medications  lidocaine (PF) (XYLOCAINE) 1 % injection 10 mL (10 mLs Infiltration Given 04/02/24 2345)  amoxicillin -clavulanate (AUGMENTIN) 875-125 MG per tablet 1 tablet (1 tablet Oral Given 04/02/24 2337)     Procedures  /  Critical Care .Incision and Drainage  Date/Time: 04/03/2024 12:58 AM  Performed by: Theadore Ozell HERO, MD Authorized by: Theadore Ozell HERO, MD  Consent:    Consent obtained:  Verbal   Consent given by:  Patient   Risks, benefits, and alternatives were discussed: yes     Risks discussed:  Bleeding, damage to other organs, infection, incomplete drainage and pain Universal protocol:    Procedure explained and questions answered to patient or proxy's satisfaction: yes     Immediately prior to procedure, a time out was called: yes     Patient identity confirmed:  Verbally with patient Location:    Type:  Abscess   Size:  3cm   Location:  Lower extremity   Lower extremity location:  Leg   Leg location:  L lower leg Pre-procedure details:    Skin preparation:  Chlorhexidine with alcohol and povidone-iodine Sedation:    Sedation type:  None Anesthesia:    Anesthesia method:  Local infiltration   Local anesthetic:  Lidocaine 1% w/o epi Procedure  type:    Complexity:  Complex Procedure details:    Incision types:  Single straight   Incision depth:  Subcutaneous   Wound management:  Probed and deloculated, irrigated with saline, extensive cleaning and debrided   Drainage:  Purulent   Drainage amount:  Moderate   Wound treatment:  Wound left open   Packing materials:  None Post-procedure details:    Procedure completion:  Tolerated well, no immediate complications   ED Course and Medical Decision Making  Initial Impression and Ddx Abscess likely related to injection of drugs.  Will proceed with I&D.  Past medical/surgical history that increases complexity of ED encounter: Substance use disorder  Interpretation of Diagnostics Laboratory and/or imaging options to aid in the diagnosis/care of the patient were considered.  After careful history and physical examination, it was determined that there was no indication for diagnostics at this time.  Patient Reassessment and Ultimate Disposition/Management     See procedural details above, appropriate for discharge.  Patient management required discussion with the following services or consulting groups:  None  Complexity of Problems Addressed Acute complicated illness or Injury  Additional Data Reviewed and Analyzed Further history obtained from: Further history from spouse/family member  Additional Factors Impacting ED Encounter Risk Prescriptions and Minor Procedures  Ozell HERO. Theadore, MD [facility] Emergency Medicine [facility] [email or web address]  Final Clinical Impressions(s) / ED Diagnoses     ICD-10-CM   1. Abscess  L02.91       ED Discharge Orders          Ordered    amoxicillin -clavulanate (AUGMENTIN) 875-125 MG tablet  Every 12 hours        04/03/24 0057             Discharge Instructions Discussed with and Provided to Patient:     Discharge Instructions      You were evaluated in the Emergency Department and after  careful evaluation, we did not find any emergent condition requiring admission or further testing in the hospital.  Your exam/testing today is overall reassuring.  Symptoms likely due to an abscess.  Take the Augmentin antibiotic as directed.  Tylenol  or Motrin  for pain.  Please return to the Emergency Department if you experience any worsening of your condition.   Thank you for allowing us  to be a part of your care.       Theadore Ozell HERO, MD 04/03/24 [phone]

## 2024-04-02 NOTE — ED Notes (Signed)
 Pt requested visitor to come back to treatment room with him at this time.
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
## 2024-04-03 MED ORDER — AMOXICILLIN-POT CLAVULANATE 875-125 MG PO TABS: 1.0000 | ORAL_TABLET | Freq: Two times a day (BID) | ORAL | 0 refills | Status: DC

## 2024-04-03 NOTE — Discharge Instructions (Signed)
 You were evaluated in the Emergency Department and after careful evaluation, we did not find any emergent condition requiring admission or further testing in the hospital.  Your exam/testing today is overall reassuring.  Symptoms likely due to an abscess.  Take the Augmentin antibiotic as directed.  Tylenol  or Motrin  for pain.  Please return to the Emergency Department if you experience any worsening of your condition.   Thank you for allowing us  to be a part of your care.

## 2024-04-16 ENCOUNTER — Emergency Department (HOSPITAL_COMMUNITY)
Admission: EM | Admit: 2024-04-16 | Discharge: 2024-04-16 | Disposition: A | Discharge status: Home / Self Care | Payer: Self-pay

## 2024-04-16 ENCOUNTER — Other Ambulatory Visit: Payer: Self-pay

## 2024-04-16 ENCOUNTER — Encounter (HOSPITAL_COMMUNITY): Payer: Self-pay

## 2024-04-16 DIAGNOSIS — T401X1A Poisoning by heroin, accidental (unintentional), initial encounter: Secondary | ICD-10-CM | POA: Insufficient documentation

## 2024-04-16 MED ORDER — SODIUM CHLORIDE 0.9 % IV BOLUS: 1000.0000 mL | Freq: Once | INTRAVENOUS | Status: DC

## 2024-04-16 MED ORDER — ONDANSETRON HCL 4 MG/2ML IJ SOLN: 4.0000 mg | Freq: Once | INTRAMUSCULAR | Status: DC

## 2024-04-16 MED ORDER — NALOXONE HCL 4 MG/0.1ML NA LIQD: NASAL | 0 refills | Status: AC

## 2024-04-16 NOTE — ED Notes (Signed)
Pt refused IV placement

## 2024-04-16 NOTE — ED Triage Notes (Addendum)
 [place] [facility] [place]. Pt reported snorting heroin.  4mg  IN Narcan  administered by bystanders  EMS BP 132/ 86 HR 130 96% on RA CBG 77

## 2024-04-16 NOTE — Discharge Instructions (Signed)
 Recommend stop doing drugs and obtain your narcan  kit to have with you in case of overdose

## 2024-04-16 NOTE — ED Provider Notes (Signed)
 [place] EMERGENCY DEPARTMENT AT [facility] Provider Note   CSN: [id number] Arrival date & time: 04/16/24  9062     Patient presents with: Drug Overdose   Caleb Ellis is a 33 y.o. male.   33 year old male presents for evaluation of overdose.  Per patient he did a bump of heroin and accidentally overdosed.  States he has not tried to hurt himself.  He states he last used heroin last week.  Bystanders gave him Narcan , 4 mg.  States he feels fatigued at this time.  Denies any other symptoms or concerns.   Drug Overdose Pertinent negatives include no chest pain, no abdominal pain and no shortness of breath.       Prior to Admission medications   Medication Sig Start Date End Date Taking? Authorizing Provider  amoxicillin -clavulanate (AUGMENTIN ) 875-125 MG tablet Take 1 tablet by mouth every 12 (twelve) hours. 04/03/24   Theadore Ozell HERO, MD  buprenorphine (SUBUTEX) 8 MG SUBL SL tablet Place 16 mg under the tongue daily.    [provider]  doxycycline  (VIBRAMYCIN ) 100 MG capsule Take 1 capsule (100 mg total) by mouth 2 (two) times daily. Patient not taking: Reported on 04/26/2020 01/29/20   Horton, Charmaine FALCON, MD  ibuprofen  (ADVIL ) 600 MG tablet Take 1 tablet (600 mg total) by mouth every 6 (six) hours as needed. Patient not taking: Reported on 04/26/2020 01/16/20   Nivia Colon, PA-C  naloxone  (NARCAN ) nasal spray 4 mg/0.1 mL Use for overdose 04/16/24   Marico Buckle L, DO  penicillin  v potassium (VEETID) 500 MG tablet Take 1 tablet (500 mg total) by mouth 3 (three) times daily. Patient not taking: Reported on 04/26/2020 01/16/20   Tran, Bowie, PA-C    Allergies: Patient has no known allergies.    Review of Systems  Constitutional:  Positive for fatigue. Negative for chills and fever.  HENT:  Negative for ear pain and sore throat.   Eyes:  Negative for pain and visual disturbance.  Respiratory:  Negative for cough and shortness of breath.   Cardiovascular:   Negative for chest pain and palpitations.  Gastrointestinal:  Negative for abdominal pain and vomiting.  Genitourinary:  Negative for dysuria and hematuria.  Musculoskeletal:  Negative for arthralgias and back pain.  Skin:  Negative for color change and rash.  Neurological:  Negative for seizures and syncope.  All other systems reviewed and are negative.   Updated Vital Signs BP (!) 121/90 (BP Location: Right Arm)   Pulse 100   Temp (!) 97.5 F (36.4 C) (Oral)   Resp 14   Ht 5' 8 (1.727 m)   Wt 72.6 kg   SpO2 96%   BMI 24.33 kg/m   Physical Exam Vitals and nursing note reviewed.  Constitutional:      General: He is not in acute distress.    Appearance: Normal appearance. He is well-developed. He is not ill-appearing.  HENT:     Head: Normocephalic and atraumatic.  Eyes:     Conjunctiva/sclera: Conjunctivae normal.  Cardiovascular:     Rate and Rhythm: Normal rate and regular rhythm.     Pulses: Normal pulses.     Heart sounds: Normal heart sounds. No murmur heard. Pulmonary:     Effort: Pulmonary effort is normal. No respiratory distress.     Breath sounds: Normal breath sounds. No stridor. No wheezing or rhonchi.  Abdominal:     General: Abdomen is flat. There is no distension.     Palpations: Abdomen is  soft.     Tenderness: There is no abdominal tenderness.  Musculoskeletal:        General: No swelling.     Cervical back: Neck supple.  Skin:    General: Skin is warm and dry.     Capillary Refill: Capillary refill takes less than 2 seconds.  Neurological:     Mental Status: He is alert.  Psychiatric:        Mood and Affect: Mood normal.     (all labs ordered are listed, but only abnormal results are displayed) Labs Reviewed - No data to display  EKG: EKG Interpretation Date/Time:  Tuesday April 16 2024 10:26:29 EDT Ventricular Rate:  94 PR Interval:  136 QRS Duration:  100 QT Interval:  377 QTC Calculation: 472 R Axis:   88  Text  Interpretation: Sinus rhythm Borderline prolonged QT interval No STEMI No acute change when compared to prior EKG from 07/28/2019 Confirmed by Gennaro Bouchard ([id number]) on 04/16/2024 10:30:50 AM  Radiology: No results found.   Procedures   Medications Ordered in the ED  sodium chloride  0.9 % bolus 1,000 mL (1,000 mLs Intravenous Patient Refused/Not Given 04/16/24 1012)  ondansetron  (ZOFRAN ) injection 4 mg (4 mg Intravenous Patient Refused/Not Given 04/16/24 1012)                                    Medical Decision Making Medical Decision Making Nursing notes are reviewed. Differential diagnosis for this patient would include but not limited to: Ingestion, drug overdose, drug abuse, syncope, arrhythmia, other  Cardiac monitor interpretation: sinus rhythm, no ectopy   Emergency Department Course:  Vital signs and pulse oximetry are reviewed, evaluated by myself and found to be within normal limits prior to final disposition. Findings of laboratory testing and medical imaging are discussed with patient and family that is available. Patient agrees with the medical care plan as follows:  Patient here for overdose.  Did a bump of heroin and was woken up by bystanders to use Narcan .  Patient but awake alert with stable vital signs resting in the ER.  No respiratory depression, and has been able to ambulate without difficulty been eating.  Declined any IV or lab workup.  Counseled on cessation of drug abuse and I will give him prescription for Narcan .  Advise return for new or worsening symptoms.  He will be discharged.  He feels comfortable with this plan.  Problems Addressed: Accidental overdose of heroin, initial encounter [facility]): acute illness or injury that poses a threat to life or bodily functions  Amount and/or Complexity of Data Reviewed Independent Historian: EMS    Details: States patient was given 4mg  narcan  by bystanders External Data Reviewed: notes.    Details: Patient was seen  for overdose in the ER on 12-21-2019 5 ECG/medicine tests: ordered and independent interpretation performed. Decision-making details documented in ED Course.    Details: Patient EKG ordered and read by me in the absence of cardiologist shows sinus rhythm, no STEMI no acute abnormality  Risk OTC drugs. Prescription drug management. Diagnosis or treatment significantly limited by social determinants of health.     Final diagnoses:  Accidental overdose of heroin, initial encounter [facility])    ED Discharge Orders          Ordered    naloxone  (NARCAN ) nasal spray 4 mg/0.1 mL        04/16/24 1101  Gennaro Duwaine CROME, DO 04/16/24 1256

## 2024-04-19 ENCOUNTER — Emergency Department (HOSPITAL_COMMUNITY): Payer: Self-pay

## 2024-04-19 ENCOUNTER — Other Ambulatory Visit: Payer: Self-pay

## 2024-04-19 ENCOUNTER — Emergency Department (HOSPITAL_COMMUNITY)
Admission: EM | Admit: 2024-04-19 | Discharge: 2024-04-19 | Disposition: A | Discharge status: Home / Self Care | Payer: Self-pay | Attending: Emergency Medicine | Admitting: Emergency Medicine

## 2024-04-19 DIAGNOSIS — Y92480 Sidewalk as the place of occurrence of the external cause: Secondary | ICD-10-CM | POA: Insufficient documentation

## 2024-04-19 DIAGNOSIS — S52551A Other extraarticular fracture of lower end of right radius, initial encounter for closed fracture: Secondary | ICD-10-CM | POA: Insufficient documentation

## 2024-04-19 DIAGNOSIS — S0990XA Unspecified injury of head, initial encounter: Secondary | ICD-10-CM | POA: Insufficient documentation

## 2024-04-19 DIAGNOSIS — T07XXXA Unspecified multiple injuries, initial encounter: Secondary | ICD-10-CM

## 2024-04-19 DIAGNOSIS — S20412A Abrasion of left back wall of thorax, initial encounter: Secondary | ICD-10-CM | POA: Insufficient documentation

## 2024-04-19 DIAGNOSIS — S0081XA Abrasion of other part of head, initial encounter: Secondary | ICD-10-CM | POA: Insufficient documentation

## 2024-04-19 DIAGNOSIS — M7989 Other specified soft tissue disorders: Secondary | ICD-10-CM | POA: Insufficient documentation

## 2024-04-19 DIAGNOSIS — S60512A Abrasion of left hand, initial encounter: Secondary | ICD-10-CM | POA: Insufficient documentation

## 2024-04-19 DIAGNOSIS — S80212A Abrasion, left knee, initial encounter: Secondary | ICD-10-CM | POA: Insufficient documentation

## 2024-04-19 DIAGNOSIS — Y9355 Activity, bike riding: Secondary | ICD-10-CM | POA: Insufficient documentation

## 2024-04-19 MED ORDER — OXYCODONE-ACETAMINOPHEN 5-325 MG PO TABS: 2.0000 | ORAL_TABLET | Freq: Once | ORAL | Status: DC

## 2024-04-19 MED ORDER — TETANUS-DIPHTH-ACELL PERTUSSIS 5-2.5-18.5 LF-MCG/0.5 IM SUSY: 0.5000 mL | PREFILLED_SYRINGE | Freq: Once | INTRAMUSCULAR | Status: DC

## 2024-04-19 MED ORDER — SODIUM CHLORIDE 0.9 % IV BOLUS
1000.0000 mL | Freq: Once | INTRAVENOUS | Status: DC
Start: 2024-04-19 — End: 2024-04-19

## 2024-04-19 MED ORDER — MORPHINE SULFATE (PF) 4 MG/ML IV SOLN
4.0000 mg | Freq: Once | INTRAVENOUS | Status: AC
  Administered 2024-04-19: 4 mg via INTRAVENOUS
  Filled 2024-04-19: qty 1

## 2024-04-19 MED ORDER — OXYCODONE-ACETAMINOPHEN 5-325 MG PO TABS: 1.0000 | ORAL_TABLET | Freq: Four times a day (QID) | ORAL | 0 refills | Status: DC | PRN
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
## 2024-04-19 NOTE — ED Notes (Signed)
 CCMD called, pt on monitor

## 2024-04-19 NOTE — ED Notes (Signed)
 Ortho techs bedside.

## 2024-04-19 NOTE — Progress Notes (Signed)
 Orthopedic Tech Progress Note Patient Details:  Caleb Ellis 1991/07/17 [id number]  Ortho Devices Type of Ortho Device: Finger trap Finger Trap Weight: 5 lbs Ortho Device/Splint Location: RUE Ortho Device/Splint Interventions: Ordered, Application, Adjustment  Was texted by PA to apply finger trap device for 20 minutes and then applied sugar tong splint  Post Interventions Patient Tolerated: Poor Instructions Provided: Other (comment)  [name] 04/19/2024, 3:09 PM
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
## 2024-04-19 NOTE — ED Notes (Signed)
 Pt and gf left
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
## 2024-04-19 NOTE — ED Notes (Signed)
 Attempted to start IV x 1, patient jerked arm as soon as I stuck him. Patient refusing further IV attempts from me. Patient known IVDU. Provider notified.

## 2024-04-19 NOTE — ED Notes (Signed)
Wounds cleaned and dressed.

## 2024-04-19 NOTE — ED Notes (Signed)
 Patient transported to CT

## 2024-04-19 NOTE — Progress Notes (Signed)
 Orthopedic Tech Progress Note Patient Details:  Caleb Ellis [birth year] [id number] Removed pt from finger traps and applied sugar tong and sling per order.  Ortho Devices Type of Ortho Device: Ace wrap, Cotton web roll, Sugartong splint, Sling immobilizer Ortho Device/Splint Location: RUE Ortho Device/Splint Interventions: Ordered, Application, Adjustment   Post Interventions Patient Tolerated: Well Instructions Provided: Adjustment of device, Care of device, Poper ambulation with device  Morna Pink 04/19/2024, 3:04 PM

## 2024-04-21 ENCOUNTER — Other Ambulatory Visit: Payer: Self-pay

## 2024-04-21 ENCOUNTER — Emergency Department (HOSPITAL_COMMUNITY)
Admission: EM | Admit: 2024-04-21 | Discharge: 2024-04-21 | Disposition: A | Discharge status: Home / Self Care | Payer: Self-pay | Attending: Emergency Medicine | Admitting: Emergency Medicine

## 2024-04-21 ENCOUNTER — Encounter (HOSPITAL_COMMUNITY): Payer: Self-pay

## 2024-04-21 DIAGNOSIS — F32A Depression, unspecified: Secondary | ICD-10-CM | POA: Insufficient documentation

## 2024-04-21 DIAGNOSIS — S52501D Unspecified fracture of the lower end of right radius, subsequent encounter for closed fracture with routine healing: Secondary | ICD-10-CM | POA: Insufficient documentation
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
## 2024-04-21 NOTE — Discharge Instructions (Addendum)
 As we discussed, since you have a splint on, you do not qualify to be in the psychiatric facility  I recommend you take your pain meds as prescribed.  You need to follow-up with your orthopedic doctor tomorrow as scheduled  Please also follow-up with behavioral health urgent care regarding your behavioral health needs  Return to ER if you have thoughts of harming yourself or others or severe pain in your hands

## 2024-04-23 ENCOUNTER — Encounter (HOSPITAL_BASED_OUTPATIENT_CLINIC_OR_DEPARTMENT_OTHER): Payer: Self-pay | Admitting: Orthopedic Surgery

## 2024-04-29 ENCOUNTER — Encounter (HOSPITAL_COMMUNITY): Payer: Self-pay | Admitting: Orthopedic Surgery

## 2024-04-29 ENCOUNTER — Other Ambulatory Visit: Payer: Self-pay

## 2024-04-29 NOTE — Progress Notes (Signed)
 SDW CALL  Patient was given pre-op instructions over the phone. The opportunity was given for the patient to ask questions. No further questions asked. Patient verbalized understanding of instructions given.   PCP - denies Cardiologist -denies   PPM/ICD - denies   Chest x-ray - denies EKG - 04/17/2024 Stress Test - denies ECHO - 07/29/19 Cardiac Cath - denies  Sleep Study - denies  No DM  Last dose of GLP1 agonist-  n/a GLP1 instructions:  n/a  Blood Thinner Instructions: n/a Aspirin Instructions: n/a  ERAS Protcol - clears until 0845 PRE-SURGERY Ensure or G2- n/a  COVID TEST- n/a   Anesthesia review: no  Patient denies shortness of breath, fever, cough and chest pain over the phone call   All instructions explained to the patient, with a verbal understanding of the material. Patient agrees to go over the instructions while at home for a better understanding.     Arrival time and location were sent via text message at patient request.

## 2024-05-01 ENCOUNTER — Encounter (HOSPITAL_COMMUNITY): Payer: Self-pay | Admitting: Orthopedic Surgery

## 2024-05-01 ENCOUNTER — Other Ambulatory Visit: Payer: Self-pay

## 2024-05-01 ENCOUNTER — Ambulatory Visit (HOSPITAL_COMMUNITY): Payer: Self-pay

## 2024-05-01 ENCOUNTER — Ambulatory Visit (HOSPITAL_COMMUNITY)
Admission: RE | Admit: 2024-05-01 | Discharge: 2024-05-01 | Disposition: A | Discharge status: Home / Self Care | Payer: Self-pay | Attending: Orthopedic Surgery | Admitting: Orthopedic Surgery

## 2024-05-01 ENCOUNTER — Encounter (HOSPITAL_COMMUNITY)
Admission: RE | Disposition: A | Discharge status: Home / Self Care | Payer: Self-pay | Source: Home / Self Care | Attending: Orthopedic Surgery

## 2024-05-01 DIAGNOSIS — S52301A Unspecified fracture of shaft of right radius, initial encounter for closed fracture: Secondary | ICD-10-CM

## 2024-05-01 DIAGNOSIS — F17210 Nicotine dependence, cigarettes, uncomplicated: Secondary | ICD-10-CM | POA: Insufficient documentation

## 2024-05-01 HISTORY — PX: ORIF RADIAL FRACTURE: SHX5113

## 2024-05-01 HISTORY — DX: Unspecified viral hepatitis C without hepatic coma: B19.20

## 2024-05-01 LAB — CBC
HCT: 42.3 % (ref 39.0–52.0)
Hemoglobin: 13.9 g/dL (ref 13.0–17.0)
MCH: 30.3 pg (ref 26.0–34.0)
MCHC: 32.9 g/dL (ref 30.0–36.0)
MCV: 92.4 fL (ref 80.0–100.0)
Platelets: 296 K/uL (ref 150–400)
RBC: 4.58 MIL/uL (ref 4.22–5.81)
RDW: 13.1 % (ref 11.5–15.5)
WBC: 8.2 K/uL (ref 4.0–10.5)
nRBC: 0 % (ref 0.0–0.2)

## 2024-05-01 LAB — COMPREHENSIVE METABOLIC PANEL WITH GFR
ALT: 51 U/L — ABNORMAL HIGH (ref 0–44)
AST: 35 U/L (ref 15–41)
Albumin: 3.4 g/dL — ABNORMAL LOW (ref 3.5–5.0)
Alkaline Phosphatase: 64 U/L (ref 38–126)
Anion gap: 9 (ref 5–15)
BUN: 11 mg/dL (ref 6–20)
CO2: 28 mmol/L (ref 22–32)
Calcium: 9.3 mg/dL (ref 8.9–10.3)
Chloride: 102 mmol/L (ref 98–111)
Creatinine, Ser: 0.87 mg/dL (ref 0.61–1.24)
GFR, Estimated: >60 mL/min (ref >60)
Glucose, Bld: 79 mg/dL (ref 70–99)
Potassium: 4.3 mmol/L (ref 3.5–5.1)
Sodium: 139 mmol/L (ref 135–145)
Total Bilirubin: 0.4 mg/dL (ref 0.0–1.2)
Total Protein: 6.3 g/dL — ABNORMAL LOW (ref 6.5–8.1)

## 2024-05-01 SURGERY — OPEN REDUCTION INTERNAL FIXATION (ORIF) RADIAL FRACTURE
Anesthesia: Monitor Anesthesia Care | Site: Arm Lower | Laterality: Right

## 2024-05-01 MED ORDER — PROPOFOL 500 MG/50ML IV EMUL
INTRAVENOUS | Status: DC | PRN
Start: 2024-05-01 — End: 2024-05-01
  Administered 2024-05-01: 150 ug/kg/min via INTRAVENOUS

## 2024-05-01 MED ORDER — CEFAZOLIN SODIUM-DEXTROSE 2-4 GM/100ML-% IV SOLN
2.0000 g | INTRAVENOUS | Status: AC
  Administered 2024-05-01: 2 g via INTRAVENOUS
  Filled 2024-05-01: qty 100

## 2024-05-01 MED ORDER — PROPOFOL 10 MG/ML IV BOLUS
INTRAVENOUS | Status: DC | PRN
  Administered 2024-05-01: 50 mg via INTRAVENOUS

## 2024-05-01 MED ORDER — OXYCODONE HCL 5 MG/5ML PO SOLN: 5.0000 mg | Freq: Once | ORAL | Status: DC | PRN

## 2024-05-01 MED ORDER — STERILE WATER FOR IRRIGATION IR SOLN
Status: DC | PRN
  Administered 2024-05-01: 1000 mL

## 2024-05-01 MED ORDER — LACTATED RINGERS IV SOLN: INTRAVENOUS | Status: DC | PRN

## 2024-05-01 MED ORDER — FENTANYL CITRATE (PF) 100 MCG/2ML IJ SOLN
INTRAMUSCULAR | Status: DC | PRN
  Administered 2024-05-01: 100 ug via INTRAVENOUS

## 2024-05-01 MED ORDER — MIDAZOLAM HCL 2 MG/2ML IJ SOLN
INTRAMUSCULAR | Status: AC
  Filled 2024-05-01: qty 2

## 2024-05-01 MED ORDER — CHLORHEXIDINE GLUCONATE 0.12 % MT SOLN
15.0000 mL | Freq: Once | OROMUCOSAL | Status: AC
  Administered 2024-05-01: 15 mL via OROMUCOSAL
  Filled 2024-05-01: qty 15

## 2024-05-01 MED ORDER — ONDANSETRON HCL 4 MG/2ML IJ SOLN
INTRAMUSCULAR | Status: DC | PRN
  Administered 2024-05-01: 4 mg via INTRAVENOUS

## 2024-05-01 MED ORDER — ORAL CARE MOUTH RINSE: 15.0000 mL | Freq: Once | OROMUCOSAL | Status: AC

## 2024-05-01 MED ORDER — MIDAZOLAM HCL 2 MG/2ML IJ SOLN: 0.5000 mg | Freq: Once | INTRAMUSCULAR | Status: DC | PRN

## 2024-05-01 MED ORDER — LACTATED RINGERS IV SOLN: INTRAVENOUS | Status: DC

## 2024-05-01 MED ORDER — HYDROMORPHONE HCL 1 MG/ML IJ SOLN: 0.2500 mg | INTRAMUSCULAR | Status: DC | PRN

## 2024-05-01 MED ORDER — MEPERIDINE HCL 25 MG/ML IJ SOLN: 6.2500 mg | INTRAMUSCULAR | Status: DC | PRN

## 2024-05-01 MED ORDER — MIDAZOLAM HCL 2 MG/2ML IJ SOLN
INTRAMUSCULAR | Status: DC | PRN
  Administered 2024-05-01: 2 mg via INTRAVENOUS

## 2024-05-01 MED ORDER — DEXMEDETOMIDINE HCL IN NACL 80 MCG/20ML IV SOLN
INTRAVENOUS | Status: DC | PRN
  Administered 2024-05-01 (×3): 4 ug via INTRAVENOUS

## 2024-05-01 MED ORDER — PHENYLEPHRINE HCL (PRESSORS) 10 MG/ML IV SOLN
INTRAVENOUS | Status: DC | PRN
  Administered 2024-05-01: 80 ug via INTRAVENOUS
  Administered 2024-05-01: 160 ug via INTRAVENOUS
  Administered 2024-05-01: 80 ug via INTRAVENOUS
  Administered 2024-05-01: 160 ug via INTRAVENOUS

## 2024-05-01 MED ORDER — FENTANYL CITRATE (PF) 100 MCG/2ML IJ SOLN
INTRAMUSCULAR | Status: AC
  Filled 2024-05-01: qty 2

## 2024-05-01 MED ORDER — OXYCODONE HCL 5 MG PO TABS: 5.0000 mg | ORAL_TABLET | ORAL | 0 refills | Status: AC | PRN

## 2024-05-01 MED ORDER — OXYCODONE HCL 5 MG PO TABS: 5.0000 mg | ORAL_TABLET | Freq: Once | ORAL | Status: DC | PRN

## 2024-05-01 SURGICAL SUPPLY — 44 items
BAG COUNTER SPONGE SURGICOUNT (BAG) ×1 IMPLANT
BIT DRILL 110X2.5XQCK CNCT (BIT) IMPLANT
BNDG ELASTIC 3INX 5YD STR LF (GAUZE/BANDAGES/DRESSINGS) IMPLANT
BNDG ELASTIC 4INX 5YD STR LF (GAUZE/BANDAGES/DRESSINGS) IMPLANT
BNDG GAUZE DERMACEA FLUFF 4 (GAUZE/BANDAGES/DRESSINGS) IMPLANT
CORD BIPOLAR FORCEPS 12FT (ELECTRODE) ×1 IMPLANT
COVER SURGICAL LIGHT HANDLE (MISCELLANEOUS) ×1 IMPLANT
CUFF TOURN SGL QUICK 18X4 (TOURNIQUET CUFF) ×1 IMPLANT
DRAPE INCISE IOBAN 66X45 STRL (DRAPES) IMPLANT
DRAPE U-SHAPE 47X51 STRL (DRAPES) ×1 IMPLANT
DRSG ADAPTIC 3X8 NADH LF (GAUZE/BANDAGES/DRESSINGS) ×1 IMPLANT
GAUZE SPONGE 4X4 12PLY STRL (GAUZE/BANDAGES/DRESSINGS) ×1 IMPLANT
GAUZE XEROFORM 5X9 LF (GAUZE/BANDAGES/DRESSINGS) ×1 IMPLANT
GLOVE BIO SURGEON STRL SZ7 (GLOVE) ×1 IMPLANT
GLOVE BIOGEL PI IND STRL 7.0 (GLOVE) ×1 IMPLANT
GOWN STRL REUS W/ TWL LRG LVL3 (GOWN DISPOSABLE) ×1 IMPLANT
KIT BASIN OR (CUSTOM PROCEDURE TRAY) ×1 IMPLANT
KIT TURNOVER KIT B (KITS) ×1 IMPLANT
NDL 22X1.5 STRL (OR ONLY) (MISCELLANEOUS) IMPLANT
NDL HYPO 22X1.5 SAFETY MO (MISCELLANEOUS) IMPLANT
NEEDLE 22X1.5 STRL (OR ONLY) (MISCELLANEOUS) IMPLANT
NEEDLE HYPO 22X1.5 SAFETY MO (MISCELLANEOUS) ×1 IMPLANT
NS IRRIG 1000ML POUR BTL (IV SOLUTION) ×1 IMPLANT
PACK ORTHO EXTREMITY (CUSTOM PROCEDURE TRAY) ×1 IMPLANT
PAD ARMBOARD POSITIONER FOAM (MISCELLANEOUS) ×2 IMPLANT
PAD CAST 4YDX4 CTTN HI CHSV (CAST SUPPLIES) IMPLANT
PLATE 8 HOLE LOCKING 3.5MM (Plate) IMPLANT
SCREW CORTICAL 3.5 16MM (Screw) IMPLANT
SCREW CORTICAL 3.5 18MM (Screw) IMPLANT
SCREW CORTICAL 3.5X14 (Screw) IMPLANT
SLING ARM FOAM STRAP LRG (SOFTGOODS) IMPLANT
SOL PREP POV-IOD 4OZ 10% (MISCELLANEOUS) ×2 IMPLANT
SPLINT FIBERGLASS 4X30 (CAST SUPPLIES) IMPLANT
SPONGE T-LAP 18X18 ~~LOC~~+RFID (SPONGE) IMPLANT
SUT MNCRL AB 3-0 PS2 18 (SUTURE) IMPLANT
SUT MNCRL AB 4-0 PS2 18 (SUTURE) ×1 IMPLANT
SUT VICRYL RAPIDE 4/0 PS 2 (SUTURE) IMPLANT
SYR CONTROL 10ML LL (SYRINGE) IMPLANT
TOWEL GREEN STERILE (TOWEL DISPOSABLE) ×1 IMPLANT
TOWEL GREEN STERILE FF (TOWEL DISPOSABLE) ×1 IMPLANT
TUBE CONNECTING 12X1/4 (SUCTIONS) ×1 IMPLANT
TUBE EVACUATION TLS (MISCELLANEOUS) ×1 IMPLANT
UNDERPAD 30X36 HEAVY ABSORB (UNDERPADS AND DIAPERS) ×1 IMPLANT
WATER STERILE IRR 1000ML POUR (IV SOLUTION) ×1 IMPLANT

## 2024-05-01 NOTE — Transfer of Care (Signed)
 Immediate Anesthesia Transfer of Care Note  Patient: Caleb Ellis  Procedure(s) Performed: OPEN REDUCTION INTERNAL FIXATION (ORIF) RADIAL FRACTURE (Right: Arm Lower)  Patient Location: PACU  Anesthesia Type:MAC and Regional  Level of Consciousness: awake, alert , and oriented  Airway & Oxygen Therapy: Patient Spontanous Breathing  Post-op Assessment: Report given to RN and Post -op Vital signs reviewed and stable  Post vital signs: Reviewed and stable  Last Vitals:  Vitals Value Taken Time  BP 142/73 05/01/24 19:15  Temp    Pulse 72 05/01/24 19:18  Resp 16 05/01/24 19:18  SpO2 99 % 05/01/24 19:18  Vitals shown include unfiled device data.  Last Pain:  Vitals:   05/01/24 0943  TempSrc:   PainSc: 3       Patients Stated Pain Goal: 0 (05/01/24 0943)  Complications: No notable events documented.

## 2024-05-01 NOTE — Anesthesia Preprocedure Evaluation (Signed)
 Anesthesia Evaluation  Patient identified by MRN, date of birth, ID band Patient awake    Reviewed: Allergy & Precautions, H&P , NPO status , Patient's Chart, lab work & pertinent test results  Airway Mallampati: II  TM Distance: >3 FB Neck ROM: Full    Dental no notable dental hx.    Pulmonary neg pulmonary ROS, Current Smoker   Pulmonary exam normal breath sounds clear to auscultation       Cardiovascular negative cardio ROS Normal cardiovascular exam Rhythm:Regular Rate:Normal     Neuro/Psych  PSYCHIATRIC DISORDERS  Depression Bipolar Disorder   negative neurological ROS     GI/Hepatic negative GI ROS,,,(+)     substance abuse  methamphetamine use and IV drug use, Hepatitis -, C  Endo/Other  negative endocrine ROS    Renal/GU negative Renal ROS  negative genitourinary   Musculoskeletal negative musculoskeletal ROS (+)    Abdominal   Peds negative pediatric ROS (+)  Hematology negative hematology ROS (+)   Anesthesia Other Findings   Reproductive/Obstetrics negative OB ROS                              Anesthesia Physical Anesthesia Plan  ASA: 2  Anesthesia Plan: MAC and Regional   Post-op Pain Management: Regional block*   Induction: Intravenous  PONV Risk Score and Plan: 0 and Propofol  infusion and Treatment may vary due to age or medical condition  Airway Management Planned: Natural Airway  Additional Equipment: None  Intra-op Plan:   Post-operative Plan:   Informed Consent: I have reviewed the patients History and Physical, chart, labs and discussed the procedure including the risks, benefits and alternatives for the proposed anesthesia with the patient or authorized representative who has indicated his/her understanding and acceptance.     Dental advisory given  Plan Discussed with: CRNA  Anesthesia Plan Comments:          Anesthesia Quick  Evaluation

## 2024-05-01 NOTE — Interval H&P Note (Signed)
 History and Physical Interval Note:  05/01/2024 5:12 PM  Caleb Ellis  has presented today for surgery, with the diagnosis of Right radial shaft fracture.  The various methods of treatment have been discussed with the patient and family. After consideration of risks, benefits and other options for treatment, the patient has consented to  Procedure(s): OPEN REDUCTION INTERNAL FIXATION (ORIF) RADIAL FRACTURE (Right) as a surgical intervention.  The patient's history has been reviewed, patient examined, no change in status, stable for surgery.  I have reviewed the patient's chart and labs.  Questions were answered to the patient's satisfaction.     Dhairya Corales

## 2024-05-01 NOTE — Discharge Instructions (Signed)
 Waylan Rocher, M.D. Hand Surgery  POST-OPERATIVE DISCHARGE INSTRUCTIONS   PRESCRIPTIONS: You may have been given a prescription to be taken as directed for post-operative pain control.  You may also take over the counter ibuprofen/aleve and tylenol for pain. Take this as directed on the packaging. Do not exceed 3000 mg tylenol/acetaminophen in 24 hours.  Ibuprofen 600-800 mg (3-4) tablets by mouth every 6 hours as needed for pain.  OR Aleve 2 tablets by mouth every 12 hours (twice daily) as needed for pain.  AND/OR Tylenol 1000 mg (2 tablets) every 8 hours as needed for pain.  Please use your pain medication carefully, as refills are limited and you may not be provided with one.  As stated above, please use over the counter pain medicine - it will also be helpful with decreasing your swelling.    ANESTHESIA: After your surgery, post-surgical discomfort or pain is likely. This discomfort can last several days to a few weeks. At certain times of the day your discomfort may be more intense.   Did you receive a nerve block?  A nerve block can provide pain relief for one hour to two days after your surgery. As long as the nerve block is working, you will experience little or no sensation in the area the surgeon operated on.  As the nerve block wears off, you will begin to experience pain or discomfort. It is very important that you begin taking your prescribed pain medication before the nerve block fully wears off. Treating your pain at the first sign of the block wearing off will ensure your pain is better controlled and more tolerable when full-sensation returns. Do not wait until the pain is intolerable, as the medicine will be less effective. It is better to treat pain in advance than to try and catch up.   General Anesthesia:  If you did not receive a nerve block during your surgery, you will need to start taking your pain medication shortly after your surgery and should continue  to do so as prescribed by your surgeon.     ICE AND ELEVATION: You may use ice for the first 48-72 hours, but it is not critical.   Motion of your fingers is very important to decrease the swelling.  Elevation, as much as possible for the next 48 hours, is critical for decreasing swelling as well as for pain relief. Elevation means when you are seated or lying down, you hand should be at or above your heart. When walking, the hand needs to be at or above the level of your elbow.  If the bandage gets too tight, it may need to be loosened. Please contact our office and we will instruct you in how to do this.    SURGICAL BANDAGES:  Keep your dressing and/or splint clean and dry at all times.  Do not remove until you are seen again in the office.  If careful, you may place a plastic bag over your bandage and tape the end to shower, but be careful, do not get your bandages wet.     HAND THERAPY:  You may not need any. If you do, we will begin this at your follow up visit in the clinic.    ACTIVITY AND WORK: You are encouraged to move any fingers which are not in the bandage.  Light use of the fingers is allowed to assist the other hand with daily hygiene and eating, but strong gripping or lifting is often uncomfortable and  should be avoided.  You might miss a variable period of time from work and hopefully this issue has been discussed prior to surgery. You may not do any heavy work with your affected hand for about 2 weeks.    [facility] Second Floor, [address] [place], [place] [address] [phone]

## 2024-05-01 NOTE — Anesthesia Postprocedure Evaluation (Signed)
 Anesthesia Post Note  Patient: Caleb Ellis  Procedure(s) Performed: OPEN REDUCTION INTERNAL FIXATION (ORIF) RADIAL FRACTURE (Right: Arm Lower)     Patient location during evaluation: PACU Anesthesia Type: Regional and MAC Level of consciousness: awake and alert, oriented and patient cooperative Pain management: pain level controlled Vital Signs Assessment: post-procedure vital signs reviewed and stable Respiratory status: spontaneous breathing, nonlabored ventilation and respiratory function stable Cardiovascular status: blood pressure returned to baseline and stable Postop Assessment: no apparent nausea or vomiting, adequate PO intake and able to ambulate Anesthetic complications: no   No notable events documented.  Last Vitals:  Vitals:   05/01/24 1915 05/01/24 1930  BP: (!) 142/73 127/85  Pulse: 67 79  Resp: 12 (!) 21  Temp: 36.7 C   SpO2: 99% 98%    Last Pain:  Vitals:   05/01/24 1915  TempSrc:   PainSc: 0-No pain                 Eleanor Dimichele,E. Lynnett Langlinais
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
## 2024-05-01 NOTE — Interval H&P Note (Signed)
 History and Physical Interval Note:  05/01/2024 9:33 AM  Caleb Ellis  has presented today for surgery, with the diagnosis of Right radial shaft fracture.  The various methods of treatment have been discussed with the patient and family. After consideration of risks, benefits and other options for treatment, the patient has consented to  Procedure(s): OPEN REDUCTION INTERNAL FIXATION (ORIF) RADIAL FRACTURE (Right) as a surgical intervention.  The patient's history has been reviewed, patient examined, no change in status, stable for surgery.  I have reviewed the patient's chart and labs.  Questions were answered to the patient's satisfaction.     Anyah Swallow
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
## 2024-05-01 NOTE — Anesthesia Procedure Notes (Signed)
 Anesthesia Regional Block: Supraclavicular block   Pre-Anesthetic Checklist: , timeout performed,  Correct Patient, Correct Site, Correct Laterality,  Correct Procedure, Correct Position, site marked,  Risks and benefits discussed,  Surgical consent,  Pre-op evaluation,  At surgeon's request and post-op pain management  Laterality: Right  Prep: chloraprep       Needles:  Injection technique: Single-shot  Needle Type: Echogenic Stimulator Needle     Needle Length: 9cm  Needle Gauge: 21     Additional Needles:   Procedures:,,,, ultrasound used (permanent image in chart),,    Narrative:  Start time: 05/01/2024 3:40 PM End time: 05/01/2024 3:50 PM Injection made incrementally with aspirations every 5 mL.  Performed by: Personally  Anesthesiologist: Erma Thom SAUNDERS, MD  Additional Notes: Discussed risks and benefits of the nerve block in detail, including but not limited vascular injury, permanent nerve damage and infection.   Patient tolerated the procedure well. Local anesthetic introduced in an incremental fashion under minimal resistance after negative aspirations. No paresthesias were elicited. After completion of the procedure, no acute issues were identified and patient continued to be monitored by RN.

## 2024-05-02 ENCOUNTER — Encounter (HOSPITAL_COMMUNITY): Payer: Self-pay | Admitting: Orthopedic Surgery
